# Patient Record
Sex: Female | Born: 1980 | Race: Black or African American | Hispanic: No | Marital: Single | Smoking: Current every day smoker
Health system: Southern US, Community
[De-identification: ages and names within clinical notes are randomized; demographics above are authoritative.]

## PROBLEM LIST (undated history)

## (undated) DIAGNOSIS — M5410 Radiculopathy, site unspecified: Secondary | ICD-10-CM

## (undated) DIAGNOSIS — K5190 Ulcerative colitis, unspecified, without complications: Secondary | ICD-10-CM

## (undated) DIAGNOSIS — K56609 Unspecified intestinal obstruction, unspecified as to partial versus complete obstruction: Secondary | ICD-10-CM

## (undated) DIAGNOSIS — M4800 Spinal stenosis, site unspecified: Secondary | ICD-10-CM

## (undated) DIAGNOSIS — F99 Mental disorder, not otherwise specified: Secondary | ICD-10-CM

---

## 2001-07-06 ENCOUNTER — Emergency Department (HOSPITAL_COMMUNITY): Admission: EM | Admit: 2001-07-06 | Discharge: 2001-07-06 | Payer: Self-pay | Admitting: Emergency Medicine

## 2001-08-12 ENCOUNTER — Emergency Department (HOSPITAL_COMMUNITY): Admission: EM | Admit: 2001-08-12 | Discharge: 2001-08-13 | Payer: Self-pay | Admitting: Emergency Medicine

## 2002-03-09 ENCOUNTER — Emergency Department (HOSPITAL_COMMUNITY): Admission: EM | Admit: 2002-03-09 | Discharge: 2002-03-10 | Payer: Self-pay | Admitting: Emergency Medicine

## 2002-03-09 ENCOUNTER — Encounter: Payer: Self-pay | Admitting: Emergency Medicine

## 2002-03-14 ENCOUNTER — Emergency Department (HOSPITAL_COMMUNITY): Admission: EM | Admit: 2002-03-14 | Discharge: 2002-03-14 | Payer: Self-pay | Admitting: Emergency Medicine

## 2002-04-13 ENCOUNTER — Inpatient Hospital Stay (HOSPITAL_COMMUNITY): Admission: AD | Admit: 2002-04-13 | Discharge: 2002-04-13 | Payer: Self-pay | Admitting: *Deleted

## 2002-05-29 ENCOUNTER — Ambulatory Visit (HOSPITAL_COMMUNITY): Admission: RE | Admit: 2002-05-29 | Discharge: 2002-05-29 | Payer: Self-pay | Admitting: *Deleted

## 2002-10-10 ENCOUNTER — Inpatient Hospital Stay (HOSPITAL_COMMUNITY): Admission: AD | Admit: 2002-10-10 | Discharge: 2002-10-10 | Payer: Self-pay | Admitting: *Deleted

## 2002-10-20 ENCOUNTER — Inpatient Hospital Stay (HOSPITAL_COMMUNITY): Admission: AD | Admit: 2002-10-20 | Discharge: 2002-10-21 | Payer: Self-pay | Admitting: *Deleted

## 2003-04-22 ENCOUNTER — Emergency Department (HOSPITAL_COMMUNITY): Admission: EM | Admit: 2003-04-22 | Discharge: 2003-04-22 | Payer: Self-pay | Admitting: Emergency Medicine

## 2003-06-05 ENCOUNTER — Emergency Department (HOSPITAL_COMMUNITY): Admission: EM | Admit: 2003-06-05 | Discharge: 2003-06-05 | Payer: Self-pay

## 2003-12-05 ENCOUNTER — Emergency Department (HOSPITAL_COMMUNITY): Admission: EM | Admit: 2003-12-05 | Discharge: 2003-12-05 | Payer: Self-pay | Admitting: Emergency Medicine

## 2003-12-07 ENCOUNTER — Emergency Department (HOSPITAL_COMMUNITY): Admission: EM | Admit: 2003-12-07 | Discharge: 2003-12-07 | Payer: Self-pay | Admitting: *Deleted

## 2003-12-08 ENCOUNTER — Encounter: Admission: RE | Admit: 2003-12-08 | Discharge: 2003-12-08 | Payer: Self-pay | Admitting: Gastroenterology

## 2004-01-07 ENCOUNTER — Emergency Department (HOSPITAL_COMMUNITY): Admission: EM | Admit: 2004-01-07 | Discharge: 2004-01-07 | Payer: Self-pay | Admitting: Emergency Medicine

## 2004-04-09 ENCOUNTER — Emergency Department (HOSPITAL_COMMUNITY): Admission: EM | Admit: 2004-04-09 | Discharge: 2004-04-09 | Payer: Self-pay | Admitting: Family Medicine

## 2004-09-04 ENCOUNTER — Emergency Department (HOSPITAL_COMMUNITY): Admission: EM | Admit: 2004-09-04 | Discharge: 2004-09-04 | Payer: Self-pay | Admitting: Emergency Medicine

## 2005-03-01 ENCOUNTER — Emergency Department (HOSPITAL_COMMUNITY): Admission: EM | Admit: 2005-03-01 | Discharge: 2005-03-01 | Payer: Self-pay | Admitting: Emergency Medicine

## 2006-06-04 ENCOUNTER — Emergency Department (HOSPITAL_COMMUNITY): Admission: EM | Admit: 2006-06-04 | Discharge: 2006-06-04 | Payer: Self-pay | Admitting: Emergency Medicine

## 2007-03-26 ENCOUNTER — Emergency Department (HOSPITAL_COMMUNITY): Admission: EM | Admit: 2007-03-26 | Discharge: 2007-03-26 | Payer: Self-pay | Admitting: Family Medicine

## 2008-06-16 ENCOUNTER — Observation Stay (HOSPITAL_COMMUNITY): Admission: EM | Admit: 2008-06-16 | Discharge: 2008-06-16 | Payer: Self-pay | Admitting: Emergency Medicine

## 2008-06-17 ENCOUNTER — Emergency Department (HOSPITAL_COMMUNITY): Admission: EM | Admit: 2008-06-17 | Discharge: 2008-06-18 | Payer: Self-pay | Admitting: Emergency Medicine

## 2009-01-18 ENCOUNTER — Emergency Department (HOSPITAL_COMMUNITY): Admission: EM | Admit: 2009-01-18 | Discharge: 2009-01-18 | Payer: Self-pay | Admitting: Emergency Medicine

## 2010-01-23 ENCOUNTER — Inpatient Hospital Stay (HOSPITAL_COMMUNITY)
Admission: EM | Admit: 2010-01-23 | Discharge: 2010-01-29 | Payer: Self-pay | Source: Home / Self Care | Admitting: Emergency Medicine

## 2010-06-06 LAB — COMPREHENSIVE METABOLIC PANEL
ALT: <8 U/L (ref 0–35)
CO2: 27 mEq/L (ref 19–32)
GFR calc Af Amer: >60 mL/min (ref >60)
Glucose, Bld: 101 mg/dL — ABNORMAL HIGH (ref 70–99)
Total Protein: 6.2 g/dL (ref 6.0–8.3)

## 2010-06-06 LAB — BASIC METABOLIC PANEL
BUN: <1 mg/dL — ABNORMAL LOW (ref 6–23)
CO2: 27 mEq/L (ref 19–32)
Calcium: 8.7 mg/dL (ref 8.4–10.5)
Creatinine, Ser: 0.59 mg/dL (ref 0.4–1.2)
GFR calc non Af Amer: >60 mL/min (ref >60)
Glucose, Bld: 109 mg/dL — ABNORMAL HIGH (ref 70–99)
Potassium: 3.8 mEq/L (ref 3.5–5.1)
Sodium: 139 mEq/L (ref 135–145)

## 2010-06-06 LAB — CBC
HCT: 34 % — ABNORMAL LOW (ref 36.0–46.0)
Hemoglobin: 10.8 g/dL — ABNORMAL LOW (ref 12.0–15.0)
Hemoglobin: 11.3 g/dL — ABNORMAL LOW (ref 12.0–15.0)
Hemoglobin: 11.5 g/dL — ABNORMAL LOW (ref 12.0–15.0)
MCH: 30.8 pg (ref 26.0–34.0)
MCH: 31.1 pg (ref 26.0–34.0)
MCV: 91.2 fL (ref 78.0–100.0)
MCV: 91.5 fL (ref 78.0–100.0)
Platelets: 157 10*3/uL (ref 150–400)
Platelets: 168 10*3/uL (ref 150–400)
Platelets: 170 10*3/uL (ref 150–400)
RBC: 3.73 MIL/uL — ABNORMAL LOW (ref 3.87–5.11)
RDW: 12.9 % (ref 11.5–15.5)
WBC: 6.5 10*3/uL (ref 4.0–10.5)
WBC: 8.2 10*3/uL (ref 4.0–10.5)

## 2010-06-06 LAB — LACTIC ACID, PLASMA: Lactic Acid, Venous: 1 mmol/L (ref 0.5–2.2)

## 2010-06-06 LAB — SEDIMENTATION RATE: Sed Rate: 16 mm/hr (ref 0–22)

## 2010-06-06 LAB — PHOSPHORUS: Phosphorus: 3.4 mg/dL (ref 2.3–4.6)

## 2010-06-07 LAB — DIFFERENTIAL
Basophils Relative: 0 % (ref 0–1)
Eosinophils Absolute: 0.1 10*3/uL (ref 0.0–0.7)
Monocytes Absolute: 1 10*3/uL (ref 0.1–1.0)
Monocytes Relative: 8 % (ref 3–12)

## 2010-06-07 LAB — URINALYSIS, ROUTINE W REFLEX MICROSCOPIC
Bilirubin Urine: NEGATIVE
Glucose, UA: NEGATIVE mg/dL
Hgb urine dipstick: NEGATIVE
Ketones, ur: NEGATIVE mg/dL
Protein, ur: NEGATIVE mg/dL
Specific Gravity, Urine: 1.026 (ref 1.005–1.030)
Urobilinogen, UA: 1 mg/dL (ref 0.0–1.0)
pH: 6 (ref 5.0–8.0)

## 2010-06-07 LAB — WET PREP, GENITAL

## 2010-06-07 LAB — GC/CHLAMYDIA PROBE AMP, GENITAL: GC Probe Amp, Genital: NEGATIVE

## 2010-06-07 LAB — CBC
HCT: 41.2 % (ref 36.0–46.0)
Hemoglobin: 13.8 g/dL (ref 12.0–15.0)
MCH: 30.9 pg (ref 26.0–34.0)
MCV: 92.2 fL (ref 78.0–100.0)
Platelets: 196 10*3/uL (ref 150–400)
RBC: 4.47 MIL/uL (ref 3.87–5.11)

## 2010-06-07 LAB — BASIC METABOLIC PANEL
Calcium: 9.2 mg/dL (ref 8.4–10.5)
Chloride: 106 mEq/L (ref 96–112)
GFR calc Af Amer: >60 mL/min (ref >60)
Potassium: 3.6 mEq/L (ref 3.5–5.1)
Sodium: 139 mEq/L (ref 135–145)

## 2010-06-07 LAB — URINE MICROSCOPIC-ADD ON

## 2010-07-06 LAB — URINE MICROSCOPIC-ADD ON

## 2010-07-06 LAB — DIFFERENTIAL
Basophils Absolute: 0 10*3/uL (ref 0.0–0.1)
Eosinophils Absolute: 0 10*3/uL (ref 0.0–0.7)
Eosinophils Relative: 0 % (ref 0–5)
Lymphocytes Relative: 8 % — ABNORMAL LOW (ref 12–46)
Lymphs Abs: 1.1 10*3/uL (ref 0.7–4.0)
Lymphs Abs: 1.9 10*3/uL (ref 0.7–4.0)
Monocytes Relative: 12 % (ref 3–12)
Neutro Abs: 10.8 10*3/uL — ABNORMAL HIGH (ref 1.7–7.7)
Neutrophils Relative %: 59 % (ref 43–77)
Neutrophils Relative %: 80 % — ABNORMAL HIGH (ref 43–77)

## 2010-07-06 LAB — URINALYSIS, ROUTINE W REFLEX MICROSCOPIC
Glucose, UA: 100 mg/dL — AB
Glucose, UA: NEGATIVE mg/dL
Specific Gravity, Urine: 1.017 (ref 1.005–1.030)
Specific Gravity, Urine: 1.017 (ref 1.005–1.030)
Urobilinogen, UA: 4 mg/dL — ABNORMAL HIGH (ref 0.0–1.0)

## 2010-07-06 LAB — CBC
HCT: 32.4 % — ABNORMAL LOW (ref 36.0–46.0)
MCV: 91.4 fL (ref 78.0–100.0)
MCV: 91.8 fL (ref 78.0–100.0)
Platelets: 149 10*3/uL — ABNORMAL LOW (ref 150–400)
RBC: 4.12 MIL/uL (ref 3.87–5.11)
RDW: 13.4 % (ref 11.5–15.5)
WBC: 13.6 10*3/uL — ABNORMAL HIGH (ref 4.0–10.5)
WBC: 7.8 10*3/uL (ref 4.0–10.5)

## 2010-07-06 LAB — URINE CULTURE

## 2010-07-06 LAB — BASIC METABOLIC PANEL
BUN: 3 mg/dL — ABNORMAL LOW (ref 6–23)
Chloride: 101 mEq/L (ref 96–112)
Creatinine, Ser: 0.59 mg/dL (ref 0.4–1.2)
GFR calc non Af Amer: >60 mL/min (ref >60)
Glucose, Bld: 87 mg/dL (ref 70–99)
Potassium: 3.6 mEq/L (ref 3.5–5.1)

## 2010-07-06 LAB — POCT I-STAT, CHEM 8
BUN: 6 mg/dL (ref 6–23)
Chloride: 96 mEq/L (ref 96–112)
Creatinine, Ser: 0.8 mg/dL (ref 0.4–1.2)
Glucose, Bld: 114 mg/dL — ABNORMAL HIGH (ref 70–99)
Potassium: 3.3 mEq/L — ABNORMAL LOW (ref 3.5–5.1)
Sodium: 129 mEq/L — ABNORMAL LOW (ref 135–145)

## 2010-07-06 LAB — POCT PREGNANCY, URINE: Preg Test, Ur: NEGATIVE

## 2010-10-19 ENCOUNTER — Encounter: Payer: Self-pay | Admitting: Obstetrics & Gynecology

## 2010-12-29 LAB — POCT RAPID STREP A: Streptococcus, Group A Screen (Direct): POSITIVE — AB

## 2011-01-02 ENCOUNTER — Inpatient Hospital Stay (INDEPENDENT_AMBULATORY_CARE_PROVIDER_SITE_OTHER)
Admission: RE | Admit: 2011-01-02 | Discharge: 2011-01-02 | Disposition: A | Discharge status: Home / Self Care | Payer: Self-pay | Source: Ambulatory Visit | Attending: Family Medicine | Admitting: Family Medicine

## 2011-01-02 DIAGNOSIS — A64 Unspecified sexually transmitted disease: Secondary | ICD-10-CM

## 2011-01-02 LAB — POCT PREGNANCY, URINE: Preg Test, Ur: NEGATIVE

## 2011-01-03 LAB — GC/CHLAMYDIA PROBE AMP, GENITAL: Chlamydia, DNA Probe: NEGATIVE

## 2011-01-24 ENCOUNTER — Encounter: Payer: Self-pay | Admitting: Obstetrics & Gynecology

## 2011-12-06 ENCOUNTER — Emergency Department (HOSPITAL_COMMUNITY)
Admission: EM | Admit: 2011-12-06 | Discharge: 2011-12-06 | Discharge status: Against Medical Advice | Payer: Medicaid Other | Attending: Emergency Medicine | Admitting: Emergency Medicine

## 2011-12-06 ENCOUNTER — Encounter (HOSPITAL_COMMUNITY): Payer: Self-pay | Admitting: *Deleted

## 2011-12-06 ENCOUNTER — Emergency Department (HOSPITAL_COMMUNITY)
Admission: EM | Admit: 2011-12-06 | Discharge: 2011-12-07 | Disposition: A | Discharge status: Home / Self Care | Payer: Medicaid Other | Attending: Emergency Medicine | Admitting: Emergency Medicine

## 2011-12-06 ENCOUNTER — Encounter (HOSPITAL_COMMUNITY): Payer: Self-pay | Admitting: Emergency Medicine

## 2011-12-06 ENCOUNTER — Emergency Department (HOSPITAL_COMMUNITY): Payer: Medicaid Other

## 2011-12-06 DIAGNOSIS — K529 Noninfective gastroenteritis and colitis, unspecified: Secondary | ICD-10-CM

## 2011-12-06 DIAGNOSIS — F17200 Nicotine dependence, unspecified, uncomplicated: Secondary | ICD-10-CM | POA: Insufficient documentation

## 2011-12-06 DIAGNOSIS — R109 Unspecified abdominal pain: Secondary | ICD-10-CM

## 2011-12-06 DIAGNOSIS — K5289 Other specified noninfective gastroenteritis and colitis: Secondary | ICD-10-CM | POA: Insufficient documentation

## 2011-12-06 DIAGNOSIS — R1032 Left lower quadrant pain: Secondary | ICD-10-CM | POA: Insufficient documentation

## 2011-12-06 DIAGNOSIS — R197 Diarrhea, unspecified: Secondary | ICD-10-CM | POA: Insufficient documentation

## 2011-12-06 DIAGNOSIS — N390 Urinary tract infection, site not specified: Secondary | ICD-10-CM

## 2011-12-06 HISTORY — DX: Unspecified intestinal obstruction, unspecified as to partial versus complete obstruction: K56.609

## 2011-12-06 LAB — URINALYSIS, ROUTINE W REFLEX MICROSCOPIC
Glucose, UA: NEGATIVE mg/dL
Nitrite: NEGATIVE
Specific Gravity, Urine: 1.025 (ref 1.005–1.030)
pH: 5.5 (ref 5.0–8.0)

## 2011-12-06 LAB — CBC WITH DIFFERENTIAL/PLATELET
Basophils Absolute: 0 10*3/uL (ref 0.0–0.1)
Basophils Relative: 0 % (ref 0–1)
Basophils Relative: 0 % (ref 0–1)
Eosinophils Relative: 0 % (ref 0–5)
HCT: 42 % (ref 36.0–46.0)
HCT: 36.7 % (ref 36.0–46.0)
Hemoglobin: 14.6 g/dL (ref 12.0–15.0)
Hemoglobin: 12.7 g/dL (ref 12.0–15.0)
Lymphocytes Relative: 15 % (ref 12–46)
Lymphocytes Relative: 12 % (ref 12–46)
Lymphs Abs: 1.6 10*3/uL (ref 0.7–4.0)
MCHC: 34.8 g/dL (ref 30.0–36.0)
MCHC: 34.6 g/dL (ref 30.0–36.0)
MCV: 92.2 fL (ref 78.0–100.0)
Monocytes Absolute: 0.8 10*3/uL (ref 0.1–1.0)
Monocytes Absolute: 0.6 10*3/uL (ref 0.1–1.0)
Monocytes Relative: 7 % (ref 3–12)
Monocytes Relative: 5 % (ref 3–12)
Neutro Abs: 8.2 10*3/uL — ABNORMAL HIGH (ref 1.7–7.7)
Neutro Abs: 9.3 10*3/uL — ABNORMAL HIGH (ref 1.7–7.7)
RBC: 4.53 MIL/uL (ref 3.87–5.11)
RDW: 13.5 % (ref 11.5–15.5)

## 2011-12-06 LAB — COMPREHENSIVE METABOLIC PANEL
Alkaline Phosphatase: 75 U/L (ref 39–117)
BUN: 8 mg/dL (ref 6–23)
CO2: 24 mEq/L (ref 19–32)
Chloride: 104 mEq/L (ref 96–112)
Creatinine, Ser: 0.67 mg/dL (ref 0.50–1.10)
GFR calc Af Amer: >90 mL/min (ref >90)
GFR calc non Af Amer: >90 mL/min (ref >90)
Glucose, Bld: 74 mg/dL (ref 70–99)
Potassium: 3.6 mEq/L (ref 3.5–5.1)
Total Bilirubin: 0.6 mg/dL (ref 0.3–1.2)

## 2011-12-06 LAB — POCT I-STAT, CHEM 8
Chloride: 105 mEq/L (ref 96–112)
Glucose, Bld: 79 mg/dL (ref 70–99)
HCT: 45 % (ref 36.0–46.0)
Potassium: 3.6 mEq/L (ref 3.5–5.1)
Sodium: 141 mEq/L (ref 135–145)

## 2011-12-06 LAB — LIPASE, BLOOD: Lipase: 14 U/L (ref 11–59)

## 2011-12-06 LAB — URINE MICROSCOPIC-ADD ON

## 2011-12-06 MED ORDER — SODIUM CHLORIDE 0.9 % IV SOLN
Freq: Once | INTRAVENOUS | Status: AC
Start: 2011-12-06 — End: 2011-12-07
  Administered 2011-12-06: 23:00:00 via INTRAVENOUS

## 2011-12-06 MED ORDER — SODIUM CHLORIDE 0.9 % IV BOLUS (SEPSIS)
1000.0000 mL | INTRAVENOUS | Status: AC
  Administered 2011-12-06: 1000 mL via INTRAVENOUS

## 2011-12-06 MED ORDER — HYDROMORPHONE HCL PF 1 MG/ML IJ SOLN
1.0000 mg | Freq: Once | INTRAMUSCULAR | Status: DC
  Filled 2011-12-06: qty 1

## 2011-12-06 MED ORDER — MORPHINE SULFATE 4 MG/ML IJ SOLN
4.0000 mg | Freq: Once | INTRAMUSCULAR | Status: AC
  Administered 2011-12-06: 4 mg via INTRAVENOUS
  Filled 2011-12-06: qty 1

## 2011-12-06 MED ORDER — ONDANSETRON HCL 4 MG/2ML IJ SOLN
4.0000 mg | Freq: Once | INTRAMUSCULAR | Status: AC
  Administered 2011-12-06: 4 mg via INTRAVENOUS
  Filled 2011-12-06: qty 2

## 2011-12-06 NOTE — ED Notes (Signed)
Patient reports she had onset of abd pain at 0730 and had onset of diarrhea.  Patient has hx of bowel obstruction and her sx feel same.  She states her last normal bm was 2 weeks ago.  Patient denies n/v.  She reports normal appetitie

## 2011-12-06 NOTE — ED Provider Notes (Signed)
History     CSN: [id number]  Arrival date & time 12/06/11  7829   First MD Initiated Contact with Patient 12/06/11 2212      Chief Complaint  Patient presents with  . Abdominal Pain    (Consider location/radiation/quality/duration/timing/severity/associated sxs/prior treatment) Patient is a 31 y.o. female presenting with abdominal pain. The history is provided by the patient.  Abdominal Pain The primary symptoms of the illness include abdominal pain.  She had onset this morning of severe, crampy left lower quadrant pain without radiation. There is associated nausea but no vomiting. She denies fever, chills, sweats. She has had diarrhea. Pain is not improved after a bowel movement. She denies urinary urgency, frequency, and tenesmus. She denies flank pain. She been in the emergency department for this pain but had to leave because of a test that she had to take. She now presents to continue her evaluation. She states that similar pain in the past this into a bowel obstruction. Pain is worse with movement but nothing makes it better.  Past Medical History  Diagnosis Date  . Bowel obstruction   . Diabetes mellitus     resolved after having daughter    History reviewed. No pertinent past surgical history.  History reviewed. No pertinent family history.  History  Substance Use Topics  . Smoking status: Current Every Day Smoker -- 0.3 packs/day    Types: Cigarettes  . Smokeless tobacco: Not on file  . Alcohol Use: Yes    OB History    Grav Para Term Preterm Abortions TAB SAB Ect Mult Living                  Review of Systems  Gastrointestinal: Positive for abdominal pain.  All other systems reviewed and are negative.    Allergies  Vicodin  Home Medications   Current Outpatient Rx  Name Route Sig Dispense Refill  . ADULT MULTIVITAMIN W/MINERALS CH Oral Take 1 tablet by mouth daily.      BP 141/71  Pulse 91  Temp 99 F (37.2 C) (Oral)  Resp 16  SpO2 100%   LMP 11/29/2011  Physical Exam  Nursing note and vitals reviewed. 31year old female, resting comfortably and in no acute distress. Vital signs are significant for borderline hypertension with blood pressure 141/71. Oxygen saturation is 100%, which is normal. Head is normocephalic and atraumatic. PERRLA, EOMI. Oropharynx is clear. Neck is nontender and supple without adenopathy or JVD. Back is nontender and there is no CVA tenderness. Lungs are clear without rales, wheezes, or rhonchi. Chest is nontender. Heart has regular rate and rhythm without murmur. Abdomen is soft, flat, without masses or hepatosplenomegaly and peristalsis is hypoactive. There is generalized abdominal tenderness which seems to be worse in the left lower quadrant. There is no rebound or guarding. Extremities have no cyanosis or edema, full range of motion is present. Skin is warm and dry without rash. Neurologic: Mental status is normal, cranial nerves are intact, there are no motor or sensory deficits.   ED Course  Procedures (including critical care time)  Results for orders placed during the hospital encounter of 12/06/11  CBC WITH DIFFERENTIAL      Component Value Range   WBC 11.3 (*) 4.0 - 10.5 K/uL   RBC 3.98  3.87 - 5.11 MIL/uL   Hemoglobin 12.7  12.0 - 15.0 g/dL   HCT 56.2  13.0 - 86.5 %   MCV 92.2  78.0 - 100.0 fL   MCH 31.9  26.0 - 34.0 pg   MCHC 34.6  30.0 - 36.0 g/dL   RDW 16.1  09.6 - 04.5 %   Platelets 174  150 - 400 K/uL   Neutrophils Relative 83 (*) 43 - 77 %   Neutro Abs 9.3 (*) 1.7 - 7.7 K/uL   Lymphocytes Relative 12  12 - 46 %   Lymphs Abs 1.3  0.7 - 4.0 K/uL   Monocytes Relative 5  3 - 12 %   Monocytes Absolute 0.6  0.1 - 1.0 K/uL   Eosinophils Relative 0  0 - 5 %   Eosinophils Absolute 0.0  0.0 - 0.7 K/uL   Basophils Relative 0  0 - 1 %   Basophils Absolute 0.0  0.0 - 0.1 K/uL  URINALYSIS, ROUTINE W REFLEX MICROSCOPIC      Component Value Range   Color, Urine AMBER (*) YELLOW    APPearance CLOUDY (*) CLEAR   Specific Gravity, Urine 1.022  1.005 - 1.030   pH 6.0  5.0 - 8.0   Glucose, UA NEGATIVE  NEGATIVE mg/dL   Hgb urine dipstick NEGATIVE  NEGATIVE   Bilirubin Urine SMALL (*) NEGATIVE   Ketones, ur 15 (*) NEGATIVE mg/dL   Protein, ur NEGATIVE  NEGATIVE mg/dL   Urobilinogen, UA 0.2  0.0 - 1.0 mg/dL   Nitrite NEGATIVE  NEGATIVE   Leukocytes, UA SMALL (*) NEGATIVE  URINE MICROSCOPIC-ADD ON      Component Value Range   Squamous Epithelial / LPF FEW (*) RARE   WBC, UA 11-20  <3 WBC/hpf   RBC / HPF 0-2  <3 RBC/hpf   Bacteria, UA FEW (*) RARE   Urine-Other MUCOUS PRESENT     Dg Abd Acute W/chest  12/06/2011  *RADIOLOGY REPORT*  Clinical Data: Left lower quadrant pain.  ACUTE ABDOMEN SERIES (ABDOMEN 2 VIEW & CHEST 1 VIEW)  Comparison: CT 01/23/2010  Findings: The bowel gas pattern is normal.  There is no evidence of free intraperitoneal air.  No suspicious radio-opaque calculi or other significant radiographic abnormality is seen. Heart size and mediastinal contours are within normal limits.  Both lungs are clear.  IMPRESSION: No acute findings.   Original Report Authenticated By: Cyndie Chime, M.D.       1. Abdominal pain   2. Urinary tract infection       MDM  Severe abdominal pain. She had left AMA this morning, but had normal WBC and normal metabolic panel. Urinalysis had significant pyuria but also had a significant number of epithelial cells suggesting a contaminated specimen, so urinalysis will be repeated along with culture. Pregnancy test has not been done this morning and will be checked tonight.. Exam this evening is not localizing. CT had been ordered this morning and will be ordered again.  Laboratory workup shows a slightly elevated WBC compared with this morning. Catheterized urine shows significant pyuria and urinary tract infection may be the cause of her symptoms. CT is pending. Case is endorsed to Dr. Norlene Campbell to evaluate CT  report.      Dione Booze, MD 12/07/11 [phone]

## 2011-12-06 NOTE — ED Provider Notes (Signed)
History     CSN: [id number]  Arrival date & time 12/06/11  4098   First MD Initiated Contact with Patient 12/06/11 1014      Chief Complaint  Patient presents with  . Abdominal Pain    (Consider location/radiation/quality/duration/timing/severity/associated sxs/prior treatment) Patient is a 31 y.o. female presenting with abdominal pain. The history is provided by the patient.  Abdominal Pain The primary symptoms of the illness include abdominal pain and diarrhea. The primary symptoms of the illness do not include fever, fatigue, shortness of breath, nausea, vomiting or dysuria. Episode onset: 3-4 hours ago. The onset of the illness was gradual. The problem has been gradually worsening.  Onset: 3-4 hrs ago. The pain came on gradually. The abdominal pain has been gradually worsening since its onset. Pain Location: lower abd, worse in LLQ. The abdominal pain does not radiate. The severity of the abdominal pain is 10/10. The abdominal pain is relieved by nothing. The abdominal pain is exacerbated by bowel movements.  The patient has had a change in bowel habit. Symptoms associated with the illness do not include hematuria or back pain.    Past Medical History  Diagnosis Date  . Bowel obstruction   . Diabetes mellitus     resolved after having daughter    History reviewed. No pertinent past surgical history.  No family history on file.  History  Substance Use Topics  . Smoking status: Current Every Day Smoker -- 0.3 packs/day    Types: Cigarettes  . Smokeless tobacco: Not on file  . Alcohol Use: Yes    OB History    Grav Para Term Preterm Abortions TAB SAB Ect Mult Living                  Review of Systems  Constitutional: Negative for fever and fatigue.  HENT: Negative for congestion, drooling and neck pain.   Eyes: Negative for pain.  Respiratory: Negative for cough and shortness of breath.   Cardiovascular: Negative for chest pain.  Gastrointestinal: Positive for  abdominal pain and diarrhea. Negative for nausea and vomiting.  Genitourinary: Negative for dysuria and hematuria.  Musculoskeletal: Negative for back pain and gait problem.  Skin: Negative for color change.  Neurological: Negative for dizziness and headaches.  Hematological: Negative for adenopathy.  Psychiatric/Behavioral: Negative for behavioral problems.  All other systems reviewed and are negative.    Allergies  Vicodin  Home Medications   Current Outpatient Rx  Name Route Sig Dispense Refill  . ADULT MULTIVITAMIN W/MINERALS CH Oral Take 1 tablet by mouth daily.      BP 109/70  Pulse 73  Temp 98.6 F (37 C) (Oral)  Resp 14  Ht 5\' 6"  (1.676 m)  Wt 101 lb (45.813 kg)  BMI 16.30 kg/m2  SpO2 100%  LMP 11/29/2011  Physical Exam  Nursing note and vitals reviewed. Constitutional: She is oriented to person, place, and time. She appears well-developed and well-nourished.  HENT:  Head: Normocephalic.  Mouth/Throat: No oropharyngeal exudate.  Eyes: Conjunctivae normal and EOM are normal. Pupils are equal, round, and reactive to light.  Neck: Normal range of motion. Neck supple.  Cardiovascular: Normal rate, regular rhythm, normal heart sounds and intact distal pulses.  Exam reveals no gallop and no friction rub.   No murmur heard. Pulmonary/Chest: Effort normal and breath sounds normal. No respiratory distress. She has no wheezes.  Abdominal: Soft. Bowel sounds are normal. There is tenderness (mild ttp of lower abdomen, worse in LLQ). There is no  rebound and no guarding.  Musculoskeletal: Normal range of motion. She exhibits no edema and no tenderness.  Neurological: She is alert and oriented to person, place, and time.  Skin: Skin is warm and dry.  Psychiatric: She has a normal mood and affect. Her behavior is normal.    ED Course  Procedures (including critical care time)  Labs Reviewed  URINALYSIS, ROUTINE W REFLEX MICROSCOPIC - Abnormal; Notable for the following:     Color, Urine AMBER (*)  BIOCHEMICALS MAY BE AFFECTED BY COLOR   APPearance HAZY (*)     Bilirubin Urine SMALL (*)     Ketones, ur 15 (*)     Leukocytes, UA MODERATE (*)     All other components within normal limits  POCT I-STAT, CHEM 8 - Abnormal; Notable for the following:    Hemoglobin 15.3 (*)     All other components within normal limits  CBC WITH DIFFERENTIAL - Abnormal; Notable for the following:    WBC 10.6 (*)     Neutro Abs 8.2 (*)     All other components within normal limits  URINE MICROSCOPIC-ADD ON - Abnormal; Notable for the following:    Squamous Epithelial / LPF MANY (*)     Bacteria, UA FEW (*)     All other components within normal limits  COMPREHENSIVE METABOLIC PANEL  LIPASE, BLOOD   Dg Abd Acute W/chest  12/06/2011  *RADIOLOGY REPORT*  Clinical Data: Left lower quadrant pain.  ACUTE ABDOMEN SERIES (ABDOMEN 2 VIEW & CHEST 1 VIEW)  Comparison: CT 01/23/2010  Findings: The bowel gas pattern is normal.  There is no evidence of free intraperitoneal air.  No suspicious radio-opaque calculi or other significant radiographic abnormality is seen. Heart size and mediastinal contours are within normal limits.  Both lungs are clear.  IMPRESSION: No acute findings.   Original Report Authenticated By: Cyndie Chime, M.D.      1. Abdominal pain       MDM  8:36 PM 31 y.o. female w hx of DM who pw lower abd cramping and diarrhea that began several hours ago. Pt denies fever, bloody stools, vomiting. Pt AFVSS here, appears well on exam, abd soft, mild ttp of LLQ. Pt has hx of admission in 2011 d/t colitis and pyelonephritis.  Pt had coloscopy at that time, not concerning for IBD.  Will get labs, IVF, pain control.   Will get CT abdomen.    8:36 PM: Pt request to leave AMA before CT scan. Dr. Manus Gunning attempted to convince pt to stay. She is of sound mind and has decision making capacity. She was warned that leaving w/out knowing the cause of her abd pain could lead to  further pain, disability, undesired consequences, or even death. Pt understands. We also discussed returning to the ED immediately if new or worsening sx occur. We discussed the sx which are most concerning (e.g., worsening abd pain, fever) that necessitate immediate return. Welcomed pt's return. Any new prescriptions provided to the patient are listed below.  New Prescriptions   No medications on file   Clinical Impression 1. Abdominal pain         Purvis Sheffield, MD 12/06/11 2036

## 2011-12-06 NOTE — ED Notes (Signed)
Patient notified urine sample is needed.  

## 2011-12-06 NOTE — ED Notes (Signed)
Pt reports that she does not want to wait for the CT test because she has to take a test. Informed pt that she needs to drink one cup now and one in an hour. EDP informed.

## 2011-12-06 NOTE — ED Notes (Addendum)
Pt reports was here for same earlier today; abd pain lower left; LBM 2 weeks ago; denies n/v; pt had bloodwork, urinalysis, and abd xray done when here earlier today; had to leave for school obligations

## 2011-12-07 ENCOUNTER — Emergency Department (HOSPITAL_COMMUNITY): Payer: Medicaid Other

## 2011-12-07 LAB — URINALYSIS, ROUTINE W REFLEX MICROSCOPIC
Glucose, UA: NEGATIVE mg/dL
Ketones, ur: 15 mg/dL — AB
Nitrite: NEGATIVE
Protein, ur: NEGATIVE mg/dL

## 2011-12-07 LAB — URINE MICROSCOPIC-ADD ON

## 2011-12-07 MED ORDER — IOHEXOL 300 MG/ML  SOLN
20.0000 mL | INTRAMUSCULAR | Status: AC
  Administered 2011-12-07: 20 mL via ORAL

## 2011-12-07 MED ORDER — METRONIDAZOLE 500 MG PO TABS: 500.0000 mg | ORAL_TABLET | Freq: Two times a day (BID) | ORAL | Status: AC

## 2011-12-07 MED ORDER — METRONIDAZOLE IN NACL 5-0.79 MG/ML-% IV SOLN: 500.0000 mg | Freq: Once | INTRAVENOUS | Status: DC

## 2011-12-07 MED ORDER — CIPROFLOXACIN HCL 500 MG PO TABS
500.0000 mg | ORAL_TABLET | Freq: Once | ORAL | Status: AC
  Administered 2011-12-07: 500 mg via ORAL
  Filled 2011-12-07: qty 1

## 2011-12-07 MED ORDER — METRONIDAZOLE 500 MG PO TABS
500.0000 mg | ORAL_TABLET | Freq: Once | ORAL | Status: AC
  Administered 2011-12-07: 500 mg via ORAL
  Filled 2011-12-07: qty 1

## 2011-12-07 MED ORDER — CIPROFLOXACIN HCL 500 MG PO TABS: 500.0000 mg | ORAL_TABLET | Freq: Two times a day (BID) | ORAL | Status: AC

## 2011-12-07 MED ORDER — CIPROFLOXACIN IN D5W 400 MG/200ML IV SOLN: 400.0000 mg | Freq: Once | INTRAVENOUS | Status: DC

## 2011-12-07 MED ORDER — OXYCODONE-ACETAMINOPHEN 5-325 MG PO TABS: 2.0000 | ORAL_TABLET | ORAL | Status: AC | PRN

## 2011-12-07 MED ORDER — ONDANSETRON HCL 4 MG PO TABS: 4.0000 mg | ORAL_TABLET | Freq: Four times a day (QID) | ORAL | Status: AC

## 2011-12-07 NOTE — ED Notes (Signed)
The pt has one more cup of oral contrast to drink

## 2011-12-07 NOTE — ED Provider Notes (Signed)
I saw and evaluated the patient, reviewed the resident's note and I agree with the findings and plan.  Left-sided abdominal pain and diarrhea for the past several hours. History of bowel obstruction. Patient comfortably texting in the room but very dramatic and crying with abdominal exam and will not allow proper exam. X-ray unremarkable, labs noncontributory. Patient continues to be in pain but is unwilling to stay for CT scan, stating that she has an exam to take.  Glynn Octave, MD 12/07/11 [phone]

## 2011-12-07 NOTE — ED Provider Notes (Signed)
Results for orders placed during the hospital encounter of 12/06/11  CBC WITH DIFFERENTIAL      Component Value Range   WBC 11.3 (*) 4.0 - 10.5 K/uL   RBC 3.98  3.87 - 5.11 MIL/uL   Hemoglobin 12.7  12.0 - 15.0 g/dL   HCT 14.7  82.9 - 56.2 %   MCV 92.2  78.0 - 100.0 fL   MCH 31.9  26.0 - 34.0 pg   MCHC 34.6  30.0 - 36.0 g/dL   RDW 13.0  86.5 - 78.4 %   Platelets 174  150 - 400 K/uL   Neutrophils Relative 83 (*) 43 - 77 %   Neutro Abs 9.3 (*) 1.7 - 7.7 K/uL   Lymphocytes Relative 12  12 - 46 %   Lymphs Abs 1.3  0.7 - 4.0 K/uL   Monocytes Relative 5  3 - 12 %   Monocytes Absolute 0.6  0.1 - 1.0 K/uL   Eosinophils Relative 0  0 - 5 %   Eosinophils Absolute 0.0  0.0 - 0.7 K/uL   Basophils Relative 0  0 - 1 %   Basophils Absolute 0.0  0.0 - 0.1 K/uL  URINALYSIS, ROUTINE W REFLEX MICROSCOPIC      Component Value Range   Color, Urine AMBER (*) YELLOW   APPearance CLOUDY (*) CLEAR   Specific Gravity, Urine 1.022  1.005 - 1.030   pH 6.0  5.0 - 8.0   Glucose, UA NEGATIVE  NEGATIVE mg/dL   Hgb urine dipstick NEGATIVE  NEGATIVE   Bilirubin Urine SMALL (*) NEGATIVE   Ketones, ur 15 (*) NEGATIVE mg/dL   Protein, ur NEGATIVE  NEGATIVE mg/dL   Urobilinogen, UA 0.2  0.0 - 1.0 mg/dL   Nitrite NEGATIVE  NEGATIVE   Leukocytes, UA SMALL (*) NEGATIVE  URINE MICROSCOPIC-ADD ON      Component Value Range   Squamous Epithelial / LPF FEW (*) RARE   WBC, UA 11-20  <3 WBC/hpf   RBC / HPF 0-2  <3 RBC/hpf   Bacteria, UA FEW (*) RARE   Urine-Other MUCOUS PRESENT     Ct Abdomen Pelvis W Contrast  12/07/2011  *RADIOLOGY REPORT*  Clinical Data: Left lower quadrant pain and cramping.  CT ABDOMEN AND PELVIS WITH CONTRAST  Technique:  Multidetector CT imaging of the abdomen and pelvis was performed following the standard protocol during bolus administration of intravenous contrast.  Contrast:  80 ml Omnipaque-300.  Comparison: 01/23/2010  Findings: The lung bases are clear.  The liver, spleen, gallbladder,  pancreas, adrenal glands, kidneys, abdominal aorta, and retroperitoneal lymph nodes are unremarkable. Small accessory spleen.  The stomach and small bowel are are not abnormally distended.  There is diffuse edema and wall thickening involving the ascending and transverse colon and probably the terminal ileum.  This is consistent with colitis and could represent infectious or inflammatory causes.  The appendix is normal.  No free air or free fluid in the abdomen.  Pelvis:  The bladder wall is not thickened.  Small amount of free fluid in the pelvis which may be physiologic or inflammatory.  The uterus and adnexal structures are not enlarged.  No significant pelvic lymphadenopathy.  Normal alignment of the lumbar vertebrae.  IMPRESSION: Diffuse edema of the colonic wall involving the ascending and transverse segments and possibly involving the terminal ileum. Changes are consistent with infectious or inflammatory colitis or pseudomembranous colitis.  This is progressed since the previous study.   Original Report Authenticated By: Tawni Pummel.  STEVENS, M.D.    Dg Abd Acute W/chest  12/06/2011  *RADIOLOGY REPORT*  Clinical Data: Left lower quadrant pain.  ACUTE ABDOMEN SERIES (ABDOMEN 2 VIEW & CHEST 1 VIEW)  Comparison: CT 01/23/2010  Findings: The bowel gas pattern is normal.  There is no evidence of free intraperitoneal air.  No suspicious radio-opaque calculi or other significant radiographic abnormality is seen. Heart size and mediastinal contours are within normal limits.  Both lungs are clear.  IMPRESSION: No acute findings.   Original Report Authenticated By: Cyndie Chime, M.D.     3:36 AM CT scan showing inflammation consistent with colitis. Will treat with antibiotics and pain control. Will refer to outpatient GI. Findings discussed with patient. She does not wish IV dosing of medications as she has class the morning and wishes to get home and get some sleep before she has to go in. Patient given  precautions and reasons for return and possible admission. She understands it is important for her to followup with gastroenterology. She relates family history of ulcerative colitis in an uncle.  Olivia Mackie, MD 12/07/11 [phone]

## 2011-12-07 NOTE — ED Notes (Signed)
MD at bedside. 

## 2011-12-07 NOTE — ED Notes (Signed)
Pt to CT scan.

## 2011-12-07 NOTE — ED Notes (Signed)
Pt sleeping.  Will be scanned at 0145

## 2011-12-08 LAB — URINE CULTURE: Colony Count: NO GROWTH

## 2012-01-22 ENCOUNTER — Emergency Department (HOSPITAL_COMMUNITY): Payer: Medicaid Other

## 2012-01-22 ENCOUNTER — Encounter (HOSPITAL_COMMUNITY): Payer: Self-pay | Admitting: *Deleted

## 2012-01-22 ENCOUNTER — Emergency Department (HOSPITAL_COMMUNITY)
Admission: EM | Admit: 2012-01-22 | Discharge: 2012-01-22 | Disposition: A | Discharge status: Home / Self Care | Payer: Medicaid Other | Attending: Emergency Medicine | Admitting: Emergency Medicine

## 2012-01-22 DIAGNOSIS — S39012A Strain of muscle, fascia and tendon of lower back, initial encounter: Secondary | ICD-10-CM

## 2012-01-22 DIAGNOSIS — K5669 Other intestinal obstruction: Secondary | ICD-10-CM | POA: Insufficient documentation

## 2012-01-22 DIAGNOSIS — F17200 Nicotine dependence, unspecified, uncomplicated: Secondary | ICD-10-CM | POA: Insufficient documentation

## 2012-01-22 DIAGNOSIS — IMO0002 Reserved for concepts with insufficient information to code with codable children: Secondary | ICD-10-CM | POA: Insufficient documentation

## 2012-01-22 DIAGNOSIS — Z79899 Other long term (current) drug therapy: Secondary | ICD-10-CM | POA: Insufficient documentation

## 2012-01-22 DIAGNOSIS — K5180 Other ulcerative colitis without complications: Secondary | ICD-10-CM | POA: Insufficient documentation

## 2012-01-22 DIAGNOSIS — Y9289 Other specified places as the place of occurrence of the external cause: Secondary | ICD-10-CM | POA: Insufficient documentation

## 2012-01-22 DIAGNOSIS — S335XXA Sprain of ligaments of lumbar spine, initial encounter: Secondary | ICD-10-CM | POA: Insufficient documentation

## 2012-01-22 DIAGNOSIS — Y9389 Activity, other specified: Secondary | ICD-10-CM | POA: Insufficient documentation

## 2012-01-22 DIAGNOSIS — Z8632 Personal history of gestational diabetes: Secondary | ICD-10-CM | POA: Insufficient documentation

## 2012-01-22 HISTORY — DX: Ulcerative colitis, unspecified, without complications: K51.90

## 2012-01-22 MED ORDER — IBUPROFEN 600 MG PO TABS: 600.0000 mg | ORAL_TABLET | Freq: Four times a day (QID) | ORAL | Status: DC | PRN

## 2012-01-22 MED ORDER — CYCLOBENZAPRINE HCL 10 MG PO TABS: 10.0000 mg | ORAL_TABLET | Freq: Two times a day (BID) | ORAL | Status: DC | PRN

## 2012-01-22 NOTE — ED Notes (Signed)
Patient transported to X-ray 
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
## 2012-01-22 NOTE — ED Notes (Signed)
Patient requesting financial assistance to help pay for prescriptions.
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
## 2012-11-03 ENCOUNTER — Emergency Department (HOSPITAL_COMMUNITY)
Admission: EM | Admit: 2012-11-03 | Discharge: 2012-11-03 | Disposition: A | Discharge status: Home / Self Care | Payer: Medicaid Other | Attending: Emergency Medicine | Admitting: Emergency Medicine

## 2012-11-03 ENCOUNTER — Emergency Department (HOSPITAL_COMMUNITY): Payer: Medicaid Other

## 2012-11-03 ENCOUNTER — Encounter (HOSPITAL_COMMUNITY): Payer: Self-pay | Admitting: Emergency Medicine

## 2012-11-03 DIAGNOSIS — E119 Type 2 diabetes mellitus without complications: Secondary | ICD-10-CM | POA: Insufficient documentation

## 2012-11-03 DIAGNOSIS — Z885 Allergy status to narcotic agent status: Secondary | ICD-10-CM | POA: Insufficient documentation

## 2012-11-03 DIAGNOSIS — A599 Trichomoniasis, unspecified: Secondary | ICD-10-CM

## 2012-11-03 DIAGNOSIS — Z3202 Encounter for pregnancy test, result negative: Secondary | ICD-10-CM | POA: Insufficient documentation

## 2012-11-03 DIAGNOSIS — Z8719 Personal history of other diseases of the digestive system: Secondary | ICD-10-CM | POA: Insufficient documentation

## 2012-11-03 DIAGNOSIS — R109 Unspecified abdominal pain: Secondary | ICD-10-CM | POA: Insufficient documentation

## 2012-11-03 DIAGNOSIS — Z91040 Latex allergy status: Secondary | ICD-10-CM | POA: Insufficient documentation

## 2012-11-03 DIAGNOSIS — R110 Nausea: Secondary | ICD-10-CM | POA: Insufficient documentation

## 2012-11-03 DIAGNOSIS — Z79899 Other long term (current) drug therapy: Secondary | ICD-10-CM | POA: Insufficient documentation

## 2012-11-03 DIAGNOSIS — K5900 Constipation, unspecified: Secondary | ICD-10-CM | POA: Insufficient documentation

## 2012-11-03 DIAGNOSIS — A5900 Urogenital trichomoniasis, unspecified: Secondary | ICD-10-CM | POA: Insufficient documentation

## 2012-11-03 DIAGNOSIS — F17200 Nicotine dependence, unspecified, uncomplicated: Secondary | ICD-10-CM | POA: Insufficient documentation

## 2012-11-03 LAB — URINALYSIS, ROUTINE W REFLEX MICROSCOPIC
Bilirubin Urine: NEGATIVE
Nitrite: NEGATIVE
Specific Gravity, Urine: 1.012 (ref 1.005–1.030)
Urobilinogen, UA: 1 mg/dL (ref 0.0–1.0)
pH: 6.5 (ref 5.0–8.0)

## 2012-11-03 LAB — LIPASE, BLOOD: Lipase: 16 U/L (ref 11–59)

## 2012-11-03 LAB — C-REACTIVE PROTEIN: CRP: <0.5 mg/dL — ABNORMAL LOW (ref <0.60)

## 2012-11-03 LAB — COMPREHENSIVE METABOLIC PANEL
Albumin: 3.6 g/dL (ref 3.5–5.2)
Alkaline Phosphatase: 50 U/L (ref 39–117)
BUN: 7 mg/dL (ref 6–23)
Calcium: 8.9 mg/dL (ref 8.4–10.5)
GFR calc Af Amer: >90 mL/min (ref >90)
Potassium: 3.9 mEq/L (ref 3.5–5.1)
Sodium: 140 mEq/L (ref 135–145)
Total Protein: 6.4 g/dL (ref 6.0–8.3)

## 2012-11-03 LAB — OCCULT BLOOD, POC DEVICE: Fecal Occult Bld: NEGATIVE

## 2012-11-03 LAB — CBC WITH DIFFERENTIAL/PLATELET
Basophils Relative: 0 % (ref 0–1)
Eosinophils Absolute: 0.1 10*3/uL (ref 0.0–0.7)
Eosinophils Relative: 1 % (ref 0–5)
MCH: 32.1 pg (ref 26.0–34.0)
MCHC: 35.2 g/dL (ref 30.0–36.0)
MCV: 91 fL (ref 78.0–100.0)
Monocytes Relative: 8 % (ref 3–12)
Neutrophils Relative %: 66 % (ref 43–77)
Platelets: 180 10*3/uL (ref 150–400)

## 2012-11-03 LAB — SEDIMENTATION RATE: Sed Rate: 4 mm/hr (ref 0–22)

## 2012-11-03 LAB — URINE MICROSCOPIC-ADD ON

## 2012-11-03 MED ORDER — KETOROLAC TROMETHAMINE 30 MG/ML IJ SOLN
30.0000 mg | Freq: Once | INTRAMUSCULAR | Status: AC
  Administered 2012-11-03: 30 mg via INTRAVENOUS
  Filled 2012-11-03: qty 1

## 2012-11-03 MED ORDER — MORPHINE SULFATE 4 MG/ML IJ SOLN
4.0000 mg | Freq: Once | INTRAMUSCULAR | Status: AC
  Administered 2012-11-03: 4 mg via INTRAVENOUS
  Filled 2012-11-03: qty 1

## 2012-11-03 MED ORDER — POLYETHYLENE GLYCOL 3350 17 GM/SCOOP PO POWD: ORAL | Status: DC

## 2012-11-03 MED ORDER — AZITHROMYCIN 250 MG PO TABS
1000.0000 mg | ORAL_TABLET | Freq: Once | ORAL | Status: AC
  Administered 2012-11-03: 1000 mg via ORAL
  Filled 2012-11-03: qty 4

## 2012-11-03 MED ORDER — METRONIDAZOLE 500 MG PO TABS: 500.0000 mg | ORAL_TABLET | Freq: Two times a day (BID) | ORAL | Status: DC

## 2012-11-03 MED ORDER — CEFTRIAXONE SODIUM 250 MG IJ SOLR
250.0000 mg | Freq: Once | INTRAMUSCULAR | Status: AC
  Administered 2012-11-03: 250 mg via INTRAMUSCULAR
  Filled 2012-11-03: qty 250

## 2012-11-03 MED ORDER — ONDANSETRON HCL 4 MG/2ML IJ SOLN
4.0000 mg | Freq: Once | INTRAMUSCULAR | Status: AC
  Administered 2012-11-03: 4 mg via INTRAVENOUS
  Filled 2012-11-03: qty 2

## 2012-11-03 MED ORDER — METRONIDAZOLE 500 MG PO TABS
500.0000 mg | ORAL_TABLET | Freq: Once | ORAL | Status: AC
  Administered 2012-11-03: 500 mg via ORAL
  Filled 2012-11-03: qty 1

## 2012-11-03 MED ORDER — LIDOCAINE HCL (PF) 1 % IJ SOLN
INTRAMUSCULAR | Status: AC
  Administered 2012-11-03: 0.9 mL via INTRAMUSCULAR
  Filled 2012-11-03: qty 5

## 2012-11-03 NOTE — ED Notes (Signed)
Pt reports left upper abdominal pain x 2 weeks. Pt reports cramping in rectal area x 2 days. Pt reports history of ulcerative colitis. Pt is experiencing nausea today and unable to have a bowel movement.

## 2012-11-03 NOTE — ED Notes (Signed)
Patient transported to X-ray 

## 2012-11-03 NOTE — ED Provider Notes (Signed)
CSN: [id number]     Arrival date & time 11/03/12  0847 History     First MD Initiated Contact with Patient 11/03/12 0901     Chief Complaint  Patient presents with  . Abdominal Pain  . Nausea   (Consider location/radiation/quality/duration/timing/severity/associated sxs/prior Treatment) HPI Comments: This 32 year old female with history of colitis who presents with 2 weeks of abdominal pain. The patient reports constipation and does not remember when she had her last bowel movement. She describes abdominal pain as crampy and left-sided. It waxes and wanes. It is worse with eating. His her current pain is 7/10. She has not taken anything for pain. Patient denies any bloody stools. She denies any dysuria or fevers. Last period was 2 weeks again.  Patient is a 32 y.o. female presenting with abdominal pain. The history is provided by the patient.  Abdominal Pain Pain location:  LUQ and L flank Pain quality: aching, bloating and cramping   Pain quality: not sharp   Pain radiates to:  Does not radiate Pain severity:  Moderate Onset quality:  Gradual Duration:  2 weeks Timing:  Intermittent Progression:  Unchanged Chronicity:  Recurrent Context: eating   Relieved by:  Nothing Worsened by:  Eating Ineffective treatments:  None tried Associated symptoms: nausea and vaginal discharge   Associated symptoms: no chest pain, no diarrhea, no dysuria, no fever, no hematochezia and no vomiting     Past Medical History  Diagnosis Date  . Bowel obstruction   . Diabetes mellitus     resolved after having daughter  . Ulcerative colitis    History reviewed. No pertinent past surgical history. History reviewed. No pertinent family history. History  Substance Use Topics  . Smoking status: Current Every Day Smoker -- 0.33 packs/day    Types: Cigarettes  . Smokeless tobacco: Not on file  . Alcohol Use: No   OB History   Grav Para Term Preterm Abortions TAB SAB Ect Mult Living                 Review of Systems  Constitutional: Negative for fever.  Cardiovascular: Negative for chest pain.  Gastrointestinal: Positive for nausea and abdominal pain. Negative for vomiting, diarrhea and hematochezia.  Genitourinary: Positive for vaginal discharge. Negative for dysuria.  Skin: Negative for rash.  Neurological: Negative for headaches.  All other systems reviewed and are negative.    Allergies  Vicodin and Latex  Home Medications   Current Outpatient Rx  Name  Route  Sig  Dispense  Refill  . polyethylene glycol powder (GLYCOLAX/MIRALAX) powder      Take one capful 3 times a day until stools are soft. Once you're having regular stools, decrease to 1 time a day.   255 g   0    BP 94/57  Pulse 57  Temp(Src) 98.3 F (36.8 C) (Oral)  Resp 16  Ht 5\' 5"  (1.651 m)  Wt 100 lb (45.36 kg)  BMI 16.64 kg/m2  SpO2 100%  LMP 10/20/2012 Physical Exam  Constitutional: She is oriented to person, place, and time. She appears well-developed and well-nourished.  HENT:  Head: Normocephalic and atraumatic.  Cardiovascular: Normal rate, regular rhythm and normal heart sounds.   Pulmonary/Chest: Effort normal and breath sounds normal.  Abdominal: Soft. Bowel sounds are normal. She exhibits no distension and no mass. There is no tenderness. There is no rebound and no guarding.  Genitourinary: Rectum normal and uterus normal. Rectal exam shows no external hemorrhoid, no fissure, no mass and no  tenderness. Guaiac negative stool. Pelvic exam was performed with patient supine. Cervix exhibits discharge. Cervix exhibits no motion tenderness. Right adnexum displays no tenderness. Left adnexum displays no tenderness. No tenderness around the vagina. Vaginal discharge found.  Neurological: She is alert and oriented to person, place, and time. She has normal reflexes.  Skin: Skin is warm and dry.  Psychiatric: She has a normal mood and affect.    ED Course   Procedures (including critical care  time)  Labs Reviewed  COMPREHENSIVE METABOLIC PANEL - Abnormal; Notable for the following:    Total Bilirubin 0.2 (*)    All other components within normal limits  URINALYSIS, ROUTINE W REFLEX MICROSCOPIC - Abnormal; Notable for the following:    Leukocytes, UA SMALL (*)    All other components within normal limits  URINE MICROSCOPIC-ADD ON - Abnormal; Notable for the following:    Squamous Epithelial / LPF MANY (*)    All other components within normal limits  CBC WITH DIFFERENTIAL  LIPASE, BLOOD  SEDIMENTATION RATE  C-REACTIVE PROTEIN  OCCULT BLOOD, POC DEVICE  POCT PREGNANCY, URINE   Dg Abd 1 View  11/03/2012   *RADIOLOGY REPORT*  Clinical Data: Left sided abdominal pain, nausea, history of colitis  ABDOMEN - 1 VIEW  Comparison: 12/06/2011  Findings: No abnormally dilated loops of bowel.  Gas is seen into the distal colon.  No abnormal densities.  IMPRESSION: Negative study   Original Report Authenticated By: Esperanza Heir, M.D.   1. Constipation   2. Abdominal pain     MDM  This is a 32 year old female with a history of colitis who presents with 2 weeks of constipation and abdominal pain. Patient states that she has had episodes like this in the past and was told that it was due to ulcerative colitis. At this time she is not currently on any medications for ulcerative colitis and does not see a GI doctor. Review of patient's chart reveals normal inflammatories markers. Physical exam is benign. Basic lab work is within normal limits including inflammatory markers. A KUB was obtained and is within normal limits. Hemoccult was negative. Patient was able to po challenge.  I discussed with the patient the option of doing a CT scan but failed this time will be low yield as she has no infectious symptoms and has a benign abdominal exam at this time. Urinalysis is positive for small leuks and trichomoniasis.  Further discussion with the patient reveals that she has had copious white discharge  over the last several weeks. She denies any new sexual partners. GU exam was notable for discharge without adnexal or cervical motion tenderness. Patient was given IM Rocephin, azithromycin, and metronidazole to cover for GC and Chlamydia as well.Marland Kitchen She was educated on safe sex practices and was advised to have her partner treated. Patient stated understanding. I feel the patient's presentation is a mostly likely a combination of constipation and possible mild PID. I have little suspicion for [facility] as the patient has no right upper quadrant pain. Patient will be discharged home with a full course of Flagyl and MiraLax for constipation.  Shon Baton, MD 11/03/12 1314

## 2012-11-04 LAB — GC/CHLAMYDIA PROBE AMP
CT Probe RNA: NEGATIVE
GC Probe RNA: NEGATIVE

## 2013-08-27 IMAGING — CR DG ABDOMEN ACUTE W/ 1V CHEST
3 series · 3 of 3 positions shown · non-contrast
Comparison: CT 01/23/2010

CLINICAL DATA: Left lower quadrant pain.

ACUTE ABDOMEN SERIES (ABDOMEN 2 VIEW & CHEST 1 VIEW)

[w chest pa]
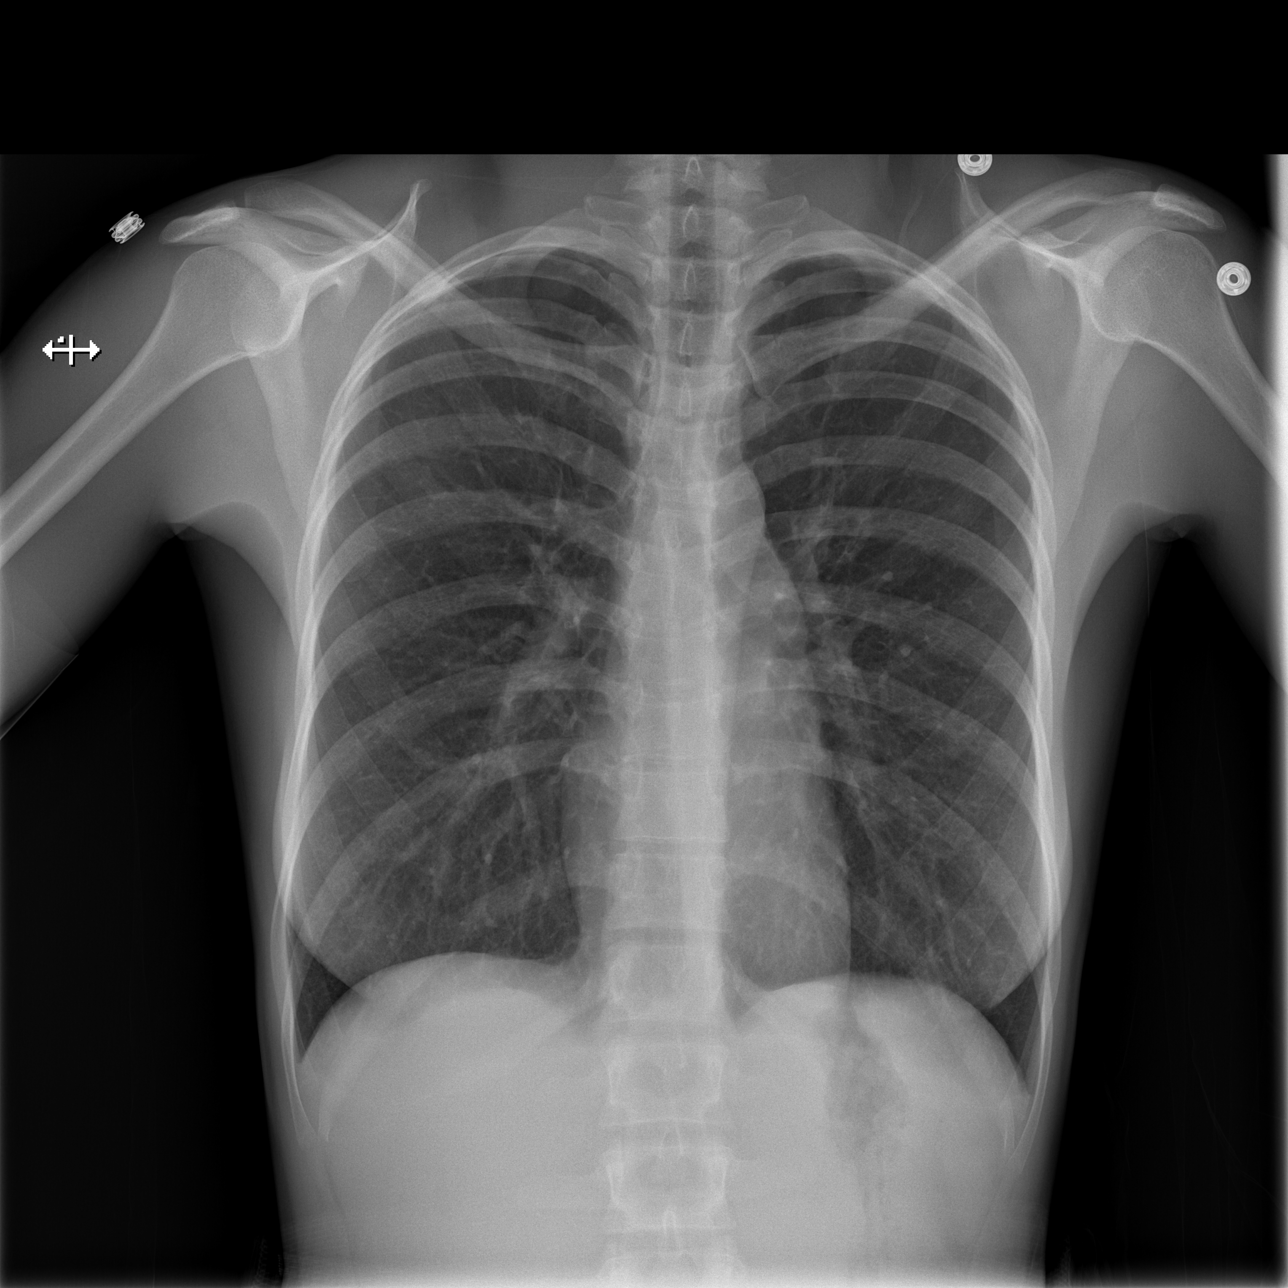

[w abdomen upright]
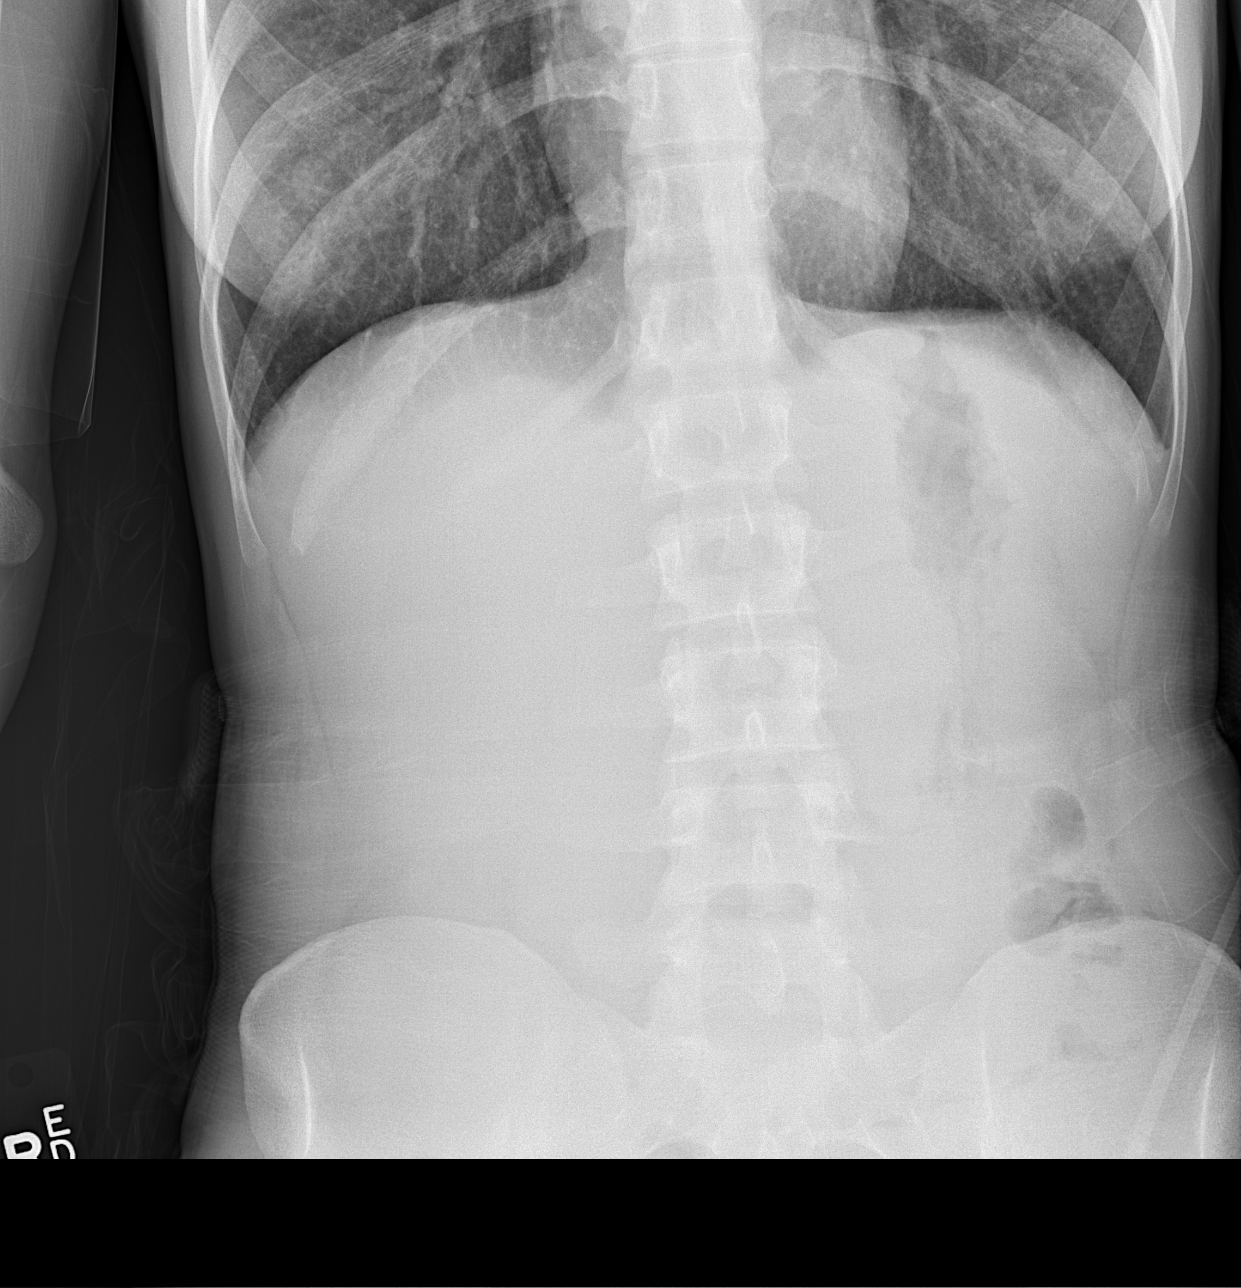

[t abdomen supine]
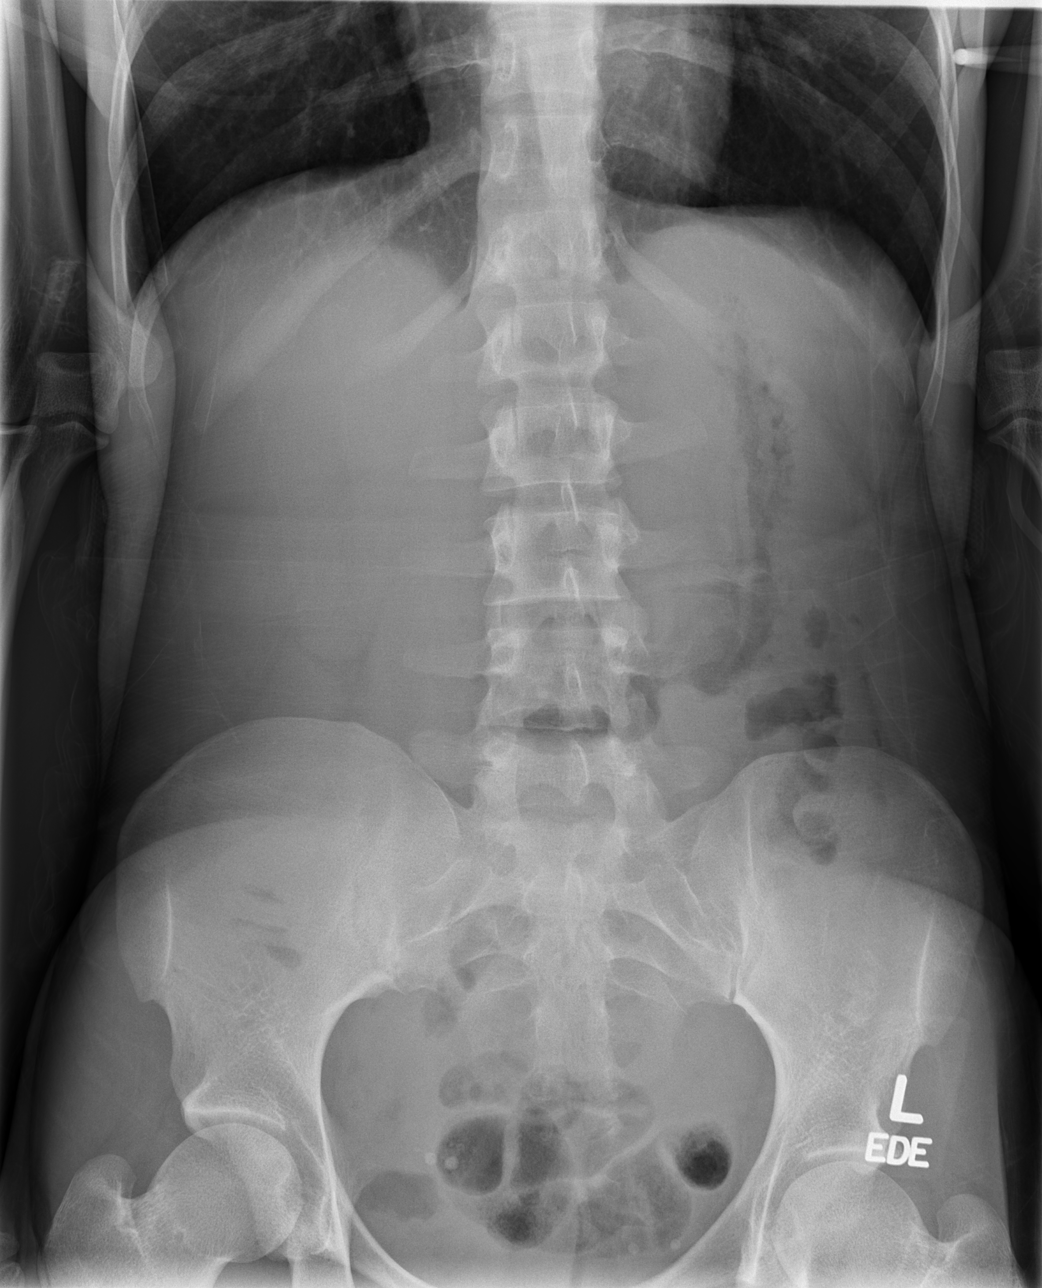

[3 of 3 positions shown; findings below may reference images not displayed]

FINDINGS: The bowel gas pattern is normal.  There is no evidence of
free intraperitoneal air.  No suspicious radio-opaque calculi or
other significant radiographic abnormality is seen. Heart size and
mediastinal contours are within normal limits.  Both lungs are
clear.
IMPRESSION: No acute findings.

## 2013-12-04 ENCOUNTER — Emergency Department (HOSPITAL_COMMUNITY)
Admission: EM | Admit: 2013-12-04 | Discharge: 2013-12-04 | Disposition: A | Discharge status: Home / Self Care | Payer: Medicaid Other | Attending: Emergency Medicine | Admitting: Emergency Medicine

## 2013-12-04 ENCOUNTER — Encounter (HOSPITAL_COMMUNITY): Payer: Self-pay | Admitting: Emergency Medicine

## 2013-12-04 DIAGNOSIS — M7989 Other specified soft tissue disorders: Secondary | ICD-10-CM | POA: Diagnosis present

## 2013-12-04 DIAGNOSIS — F17200 Nicotine dependence, unspecified, uncomplicated: Secondary | ICD-10-CM | POA: Insufficient documentation

## 2013-12-04 DIAGNOSIS — Z91040 Latex allergy status: Secondary | ICD-10-CM | POA: Diagnosis not present

## 2013-12-04 DIAGNOSIS — Z3202 Encounter for pregnancy test, result negative: Secondary | ICD-10-CM | POA: Diagnosis not present

## 2013-12-04 DIAGNOSIS — E119 Type 2 diabetes mellitus without complications: Secondary | ICD-10-CM | POA: Diagnosis not present

## 2013-12-04 DIAGNOSIS — Z79899 Other long term (current) drug therapy: Secondary | ICD-10-CM | POA: Insufficient documentation

## 2013-12-04 DIAGNOSIS — Z8719 Personal history of other diseases of the digestive system: Secondary | ICD-10-CM | POA: Diagnosis not present

## 2013-12-04 LAB — COMPREHENSIVE METABOLIC PANEL
ALBUMIN: 3.8 g/dL (ref 3.5–5.2)
ALK PHOS: 54 U/L (ref 39–117)
ALT: 11 U/L (ref 0–35)
ANION GAP: 11 (ref 5–15)
AST: 11 U/L (ref 0–37)
BUN: 5 mg/dL — ABNORMAL LOW (ref 6–23)
CO2: 25 mEq/L (ref 19–32)
Calcium: 9.1 mg/dL (ref 8.4–10.5)
Chloride: 104 mEq/L (ref 96–112)
Creatinine, Ser: 0.58 mg/dL (ref 0.50–1.10)
GFR calc Af Amer: >90 mL/min (ref >90)
GFR calc non Af Amer: >90 mL/min (ref >90)
Glucose, Bld: 81 mg/dL (ref 70–99)
POTASSIUM: 3.7 meq/L (ref 3.7–5.3)
Sodium: 140 mEq/L (ref 137–147)
Total Bilirubin: <0.2 mg/dL — ABNORMAL LOW (ref 0.3–1.2)
Total Protein: 6.5 g/dL (ref 6.0–8.3)

## 2013-12-04 LAB — URINE MICROSCOPIC-ADD ON

## 2013-12-04 LAB — URINALYSIS, ROUTINE W REFLEX MICROSCOPIC
Bilirubin Urine: NEGATIVE
GLUCOSE, UA: NEGATIVE mg/dL
KETONES UR: NEGATIVE mg/dL
Nitrite: NEGATIVE
PROTEIN: NEGATIVE mg/dL
Specific Gravity, Urine: 1.02 (ref 1.005–1.030)
Urobilinogen, UA: 0.2 mg/dL (ref 0.0–1.0)
pH: 6 (ref 5.0–8.0)

## 2013-12-04 LAB — CBC WITH DIFFERENTIAL/PLATELET
BASOS PCT: 0 % (ref 0–1)
Basophils Absolute: 0 10*3/uL (ref 0.0–0.1)
EOS ABS: 0.2 10*3/uL (ref 0.0–0.7)
EOS PCT: 2 % (ref 0–5)
HCT: 35.6 % — ABNORMAL LOW (ref 36.0–46.0)
HEMOGLOBIN: 12.2 g/dL (ref 12.0–15.0)
Lymphocytes Relative: 35 % (ref 12–46)
Lymphs Abs: 3.2 10*3/uL (ref 0.7–4.0)
MCH: 31.2 pg (ref 26.0–34.0)
MCHC: 34.3 g/dL (ref 30.0–36.0)
MCV: 91 fL (ref 78.0–100.0)
MONOS PCT: 8 % (ref 3–12)
Monocytes Absolute: 0.7 10*3/uL (ref 0.1–1.0)
NEUTROS PCT: 55 % (ref 43–77)
Neutro Abs: 5.2 10*3/uL (ref 1.7–7.7)
PLATELETS: 217 10*3/uL (ref 150–400)
RBC: 3.91 MIL/uL (ref 3.87–5.11)
RDW: 13.5 % (ref 11.5–15.5)
WBC: 9.3 10*3/uL (ref 4.0–10.5)

## 2013-12-04 LAB — PREGNANCY, URINE: Preg Test, Ur: NEGATIVE

## 2013-12-04 MED ORDER — IBUPROFEN 800 MG PO TABS
800.0000 mg | ORAL_TABLET | Freq: Once | ORAL | Status: DC
Start: 2013-12-04 — End: 2013-12-05
  Filled 2013-12-04: qty 1

## 2013-12-04 MED ORDER — ACETAMINOPHEN 325 MG PO TABS: 650.0000 mg | ORAL_TABLET | Freq: Once | ORAL | Status: DC

## 2013-12-04 NOTE — ED Notes (Addendum)
Pt refused to take ibuprofen due to her ulcerative colitis. Requesting a shot of morphine because "it works quicker".  Dr. Bebe Shaggy informed of pt's request.

## 2013-12-04 NOTE — ED Provider Notes (Signed)
CSN: [id number]     Arrival date & time 12/04/13  1742 History   First MD Initiated Contact with Patient 12/04/13 2153     Chief Complaint  Patient presents with  . Leg Swelling   The history is provided by the patient.   Regina Howell is a 33 y.o. female with PMHS for UC who presents for lower extremity pain and edema for one year.  Onset: Gradual.  Progression: unchanged.  Severity: moderate. Exacerbating factors: none. Relieving factors: unknown.  She has not been treated for this condition before.  She denies trauamtic injury.  No difficulty with ambulation.  She denies fevers, chills, calf tenderness, pleurisy, chest pain, dyspnea, orthopnea, DOE, PND, n/v, abd pain, dysuria, polyuria.  She has no history of DVT.  No OCP use. Diet unchanged. No hx of kidney disease.   Past Medical History  Diagnosis Date  . Bowel obstruction   . Diabetes mellitus     resolved after having daughter  . Ulcerative colitis    History reviewed. No pertinent past surgical history. No family history on file. History  Substance Use Topics  . Smoking status: Current Every Day Smoker -- 0.33 packs/day    Types: Cigarettes  . Smokeless tobacco: Not on file  . Alcohol Use: No   OB History   Grav Para Term Preterm Abortions TAB SAB Ect Mult Living                 Review of Systems  Constitutional: Negative for fever and chills.  Respiratory: Negative for cough, shortness of breath and wheezing.   Cardiovascular: Positive for leg swelling. Negative for chest pain and palpitations.  Gastrointestinal: Negative for nausea, vomiting and abdominal pain.  Musculoskeletal: Negative for back pain.  Skin: Negative for rash.  Neurological: Negative for weakness and numbness.  All other systems reviewed and are negative.     Allergies  Vicodin and Latex  Home Medications   Prior to Admission medications   Medication Sig Start Date End Date Taking? Authorizing Provider  metroNIDAZOLE (FLAGYL) 500 MG  tablet Take 1 tablet (500 mg total) by mouth 2 (two) times daily. 11/03/12   Shon Baton, MD  polyethylene glycol powder (GLYCOLAX/MIRALAX) powder Take one capful 3 times a day until stools are soft. Once you're having regular stools, decrease to 1 time a day. 11/03/12   Shon Baton, MD   BP 114/65  Pulse 73  Temp(Src) 98.4 F (36.9 C)  Resp 12  Ht  (1.651 m)  Wt 103 lb (46.72 kg)  BMI 17.14 kg/m2  SpO2 100%  LMP 12/04/2013 Physical Exam  Nursing note and vitals reviewed. Constitutional: She is oriented to person, place, and time. She appears well-developed and well-nourished. No distress.  Well appearing  HENT:  Head: Normocephalic and atraumatic.  Nose: Nose normal.  Mouth/Throat: No oropharyngeal exudate.  Eyes: Conjunctivae are normal. Pupils are equal, round, and reactive to light.  Neck: Normal range of motion. Neck supple. No JVD present. No tracheal deviation present.  Cardiovascular: Normal rate, regular rhythm and normal heart sounds.   No murmur heard. Pulses equal, strong  Pulmonary/Chest: Effort normal and breath sounds normal. No respiratory distress. She has no wheezes. She has no rales.  Abdominal: Soft. Bowel sounds are normal. She exhibits no distension and no mass. There is no tenderness.  Musculoskeletal: Normal range of motion. She exhibits no edema and no tenderness.  No calf TTP, edema, erythema or palpable cords  Neurological: She  is alert and oriented to person, place, and time.  Normal strength and sensation. Reflexes equal  Skin: Skin is warm and dry. No rash noted.  Psychiatric: She has a normal mood and affect.    ED Course  Procedures (including critical care time) Labs Review Labs Reviewed  URINALYSIS, ROUTINE W REFLEX MICROSCOPIC - Abnormal; Notable for the following:    APPearance CLOUDY (*)    Hgb urine dipstick LARGE (*)    Leukocytes, UA SMALL (*)    All other components within normal limits  URINE MICROSCOPIC-ADD ON -  Abnormal; Notable for the following:    Squamous Epithelial / LPF MANY (*)    Bacteria, UA FEW (*)    All other components within normal limits  CBC WITH DIFFERENTIAL - Abnormal; Notable for the following:    HCT 35.6 (*)    All other components within normal limits  COMPREHENSIVE METABOLIC PANEL - Abnormal; Notable for the following:    BUN 5 (*)    Total Bilirubin <0.2 (*)    All other components within normal limits  PREGNANCY, URINE    Imaging Review No results found.   EKG Interpretation None      MDM   Final diagnoses:  Leg swelling    Pt presents for leg swelling for one year.  Etiology unclear per history and exam.  There is no edema appreciated on exam.  NVI. VSS. Lungs CTAB. No signs of volume overload to indicate CHF. Labs unremarkable. No anemia, electrolyte abnormality, proteinuria, or kidney dysfunction. No injury. No signs of infection.  No evidence of DVT. Patient has no PCP. Provided contact information for follow up.    Sofie Rower, MD 12/05/13 [phone]

## 2013-12-04 NOTE — Discharge Instructions (Signed)

## 2013-12-04 NOTE — ED Notes (Signed)
Per friend at bedside pt at first requesting Tylenol for pain, her friend now states pt wants something "stronger" but not "too strong."

## 2013-12-04 NOTE — ED Notes (Signed)
MD Kengal at bedside to discuss discharge orders and results with pt

## 2013-12-04 NOTE — ED Notes (Signed)
Pt presents with ongoing BLE swelling x2 years and BLE pain x2-3 days. Pt also feeling weak, lethargic and feeling like she wants to "pass out" when she's in the heat. 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

## 2013-12-04 NOTE — ED Notes (Signed)
Swollen legs for the past year.  seh works 2 jobs and she reports that she needs a note for work.  She has started having bi-lateral feet pain also in the past 6 mopths  lmp now

## 2013-12-04 NOTE — ED Notes (Signed)
Pt discharged home with all belongings, pt alert, oriented and ambulatory upon discharge. No new RX prescribed, pt driven home by friend. Pt escorted to exit via wheelchair by [employer]

## 2013-12-07 NOTE — ED Provider Notes (Signed)
I have personally seen and examined the patient.  I have discussed the plan of care with the resident.  I have reviewed the documentation on PMH/FH/Soc. History.  I have reviewed the documentation of the resident and agree.  Pt well appearing, no distress, low suspicion for DVT Stable for d/c home and outpatient management   Joya Gaskins, MD 12/07/13 [phone]

## 2016-09-15 ENCOUNTER — Encounter (HOSPITAL_COMMUNITY): Payer: Self-pay | Admitting: *Deleted

## 2016-09-15 ENCOUNTER — Emergency Department (HOSPITAL_COMMUNITY)
Admission: EM | Admit: 2016-09-15 | Discharge: 2016-09-15 | Disposition: A | Discharge status: Home / Self Care | Payer: Medicaid Other | Attending: Emergency Medicine | Admitting: Emergency Medicine

## 2016-09-15 DIAGNOSIS — R1010 Upper abdominal pain, unspecified: Secondary | ICD-10-CM

## 2016-09-15 DIAGNOSIS — N390 Urinary tract infection, site not specified: Secondary | ICD-10-CM

## 2016-09-15 DIAGNOSIS — Z885 Allergy status to narcotic agent status: Secondary | ICD-10-CM | POA: Insufficient documentation

## 2016-09-15 DIAGNOSIS — F17210 Nicotine dependence, cigarettes, uncomplicated: Secondary | ICD-10-CM | POA: Insufficient documentation

## 2016-09-15 DIAGNOSIS — Z91040 Latex allergy status: Secondary | ICD-10-CM | POA: Insufficient documentation

## 2016-09-15 HISTORY — DX: Mental disorder, not otherwise specified: F99

## 2016-09-15 LAB — URINALYSIS, ROUTINE W REFLEX MICROSCOPIC
Bilirubin Urine: NEGATIVE
Glucose, UA: NEGATIVE mg/dL
Hgb urine dipstick: NEGATIVE
Ketones, ur: NEGATIVE mg/dL
Leukocytes, UA: NEGATIVE
Nitrite: POSITIVE — AB
PROTEIN: NEGATIVE mg/dL
Specific Gravity, Urine: 1.019 (ref 1.005–1.030)
pH: 6 (ref 5.0–8.0)

## 2016-09-15 LAB — COMPREHENSIVE METABOLIC PANEL
ALT: 15 U/L (ref 14–54)
AST: 20 U/L (ref 15–41)
Albumin: 4.2 g/dL (ref 3.5–5.0)
Alkaline Phosphatase: 51 U/L (ref 38–126)
Anion gap: 6 (ref 5–15)
BUN: 9 mg/dL (ref 6–20)
CHLORIDE: 106 mmol/L (ref 101–111)
CO2: 25 mmol/L (ref 22–32)
Calcium: 9.1 mg/dL (ref 8.9–10.3)
Creatinine, Ser: 0.73 mg/dL (ref 0.44–1.00)
GFR calc non Af Amer: >60 mL/min (ref >60)
Glucose, Bld: 100 mg/dL — ABNORMAL HIGH (ref 65–99)
POTASSIUM: 4 mmol/L (ref 3.5–5.1)
Sodium: 137 mmol/L (ref 135–145)
Total Bilirubin: 0.4 mg/dL (ref 0.3–1.2)
Total Protein: 7.1 g/dL (ref 6.5–8.1)

## 2016-09-15 LAB — CBC
HEMATOCRIT: 37.2 % (ref 36.0–46.0)
HEMOGLOBIN: 12.4 g/dL (ref 12.0–15.0)
MCH: 31.1 pg (ref 26.0–34.0)
MCHC: 33.3 g/dL (ref 30.0–36.0)
MCV: 93.2 fL (ref 78.0–100.0)
Platelets: 219 10*3/uL (ref 150–400)
RBC: 3.99 MIL/uL (ref 3.87–5.11)
RDW: 13.6 % (ref 11.5–15.5)
WBC: 8.8 10*3/uL (ref 4.0–10.5)

## 2016-09-15 LAB — LIPASE, BLOOD: LIPASE: 22 U/L (ref 11–51)

## 2016-09-15 LAB — C-REACTIVE PROTEIN: CRP: <0.8 mg/dL (ref <1.0)

## 2016-09-15 LAB — POC URINE PREG, ED: PREG TEST UR: NEGATIVE

## 2016-09-15 MED ORDER — OMEPRAZOLE 20 MG PO CPDR: 20.0000 mg | DELAYED_RELEASE_CAPSULE | Freq: Every day | ORAL | 0 refills | Status: DC

## 2016-09-15 MED ORDER — CEPHALEXIN 500 MG PO CAPS: 500.0000 mg | ORAL_CAPSULE | Freq: Two times a day (BID) | ORAL | 0 refills | Status: DC

## 2016-09-15 NOTE — ED Provider Notes (Signed)
[facility]-EMERGENCY DEPT Provider Note   CSN: [id number] Arrival date & time: 09/15/16  1519     History   Chief Complaint Chief Complaint  Patient presents with  . Abdominal Pain    HPI Regina Howell is a 36 y.o. female.  36 year old female with past mental history below who presents with abdominal pain. She reports 1 month of constant, gradually worsening upper abdominal pain.It is worse with laughing, coughing, and movement. She has been lost to follow up w/ GI for her UC. She denies any urinary symptoms, vaginal sx, vomiting, diarrhea, fevers. Last BM was 3 days ago, she reports mild constipation and had a small amount of blood in her stool but no ongoing bleeding.     Abdominal Pain      Past Medical History:  Diagnosis Date  . Bowel obstruction (HCC)   . Diabetes mellitus    resolved after having daughter  . Mental health disorder   . Ulcerative colitis (HCC)     There are no active problems to display for this patient.   History reviewed. No pertinent surgical history.  OB History    No data available       Home Medications    Prior to Admission medications   Medication Sig Start Date End Date Taking? Authorizing Provider  cephALEXin (KEFLEX) 500 MG capsule Take 1 capsule (500 mg total) by mouth 2 (two) times daily. 09/15/16   Demar Shad, Ambrose Finlandachel Morgan, MD  omeprazole (PRILOSEC) 20 MG capsule Take 1 capsule (20 mg total) by mouth daily. 09/15/16   Lareen Mullings, Ambrose Finlandachel Morgan, MD  polyethylene glycol powder [facility](GLYCOLAX/MIRALAX) powder Take one capful 3 times a day until stools are soft. Once you're having regular stools, decrease to 1 time a day. 11/03/12   Horton, Mayer Maskerourtney F, MD    Family History History reviewed. No pertinent family history.  Social History Social History  Substance Use Topics  . Smoking status: Current Every Day Smoker    Packs/day: 0.33    Types: Cigarettes  . Smokeless tobacco: Never Used  . Alcohol use No     Allergies   Vicodin  [hydrocodone-acetaminophen] and Latex   Review of Systems Review of Systems  Gastrointestinal: Positive for abdominal pain.     Physical Exam Updated Vital Signs BP 111/72   Pulse 71   Temp 98.3 F (36.8 C) (Oral)   Resp 16   Ht 5\' 6"  (1.676 m)   Wt 46.7 kg (103 lb)   LMP 05/18/2016   SpO2 100%   BMI 16.62 kg/m   Physical Exam  Constitutional: She is oriented to person, place, and time. She appears well-developed and well-nourished. No distress.  Playing on phone  HENT:  Head: Normocephalic and atraumatic.  Moist mucous membranes  Eyes: Conjunctivae are normal. Pupils are equal, round, and reactive to light.  Neck: Neck supple.  Cardiovascular: Normal rate, regular rhythm and normal heart sounds.   No murmur heard. Pulmonary/Chest: Effort normal and breath sounds normal.  Abdominal: Soft. Bowel sounds are normal. She exhibits no distension. There is tenderness (midepigastric to just above umbilicus).  Musculoskeletal: She exhibits no edema.  Neurological: She is alert and oriented to person, place, and time.  Fluent speech  Skin: Skin is warm and dry.  Psychiatric: She has a normal mood and affect. Judgment normal.  Nursing note and vitals reviewed.    ED Treatments / Results  Labs (all labs ordered are listed, but only abnormal results are displayed) Labs Reviewed  COMPREHENSIVE METABOLIC  PANEL - Abnormal; Notable for the following:       Result Value   Glucose, Bld 100 (*)    All other components within normal limits  URINALYSIS, ROUTINE W REFLEX MICROSCOPIC - Abnormal; Notable for the following:    APPearance CLOUDY (*)    Nitrite POSITIVE (*)    Bacteria, UA MANY (*)    Squamous Epithelial / LPF 0-5 (*)    All other components within normal limits  URINE CULTURE  LIPASE, BLOOD  CBC  C-REACTIVE PROTEIN  POC URINE PREG, ED    EKG  EKG Interpretation None       Radiology No results found.  Procedures Procedures (including critical care  time)  Medications Ordered in ED Medications - No data to display   Initial Impression / Assessment and Plan / ED Course  I have reviewed the triage vital signs and the nursing notes.  Pertinent labs  that were available during my care of the patient were reviewed by me and considered in my medical decision making (see chart for details).    Pt w/ reported hx of UC w/ 1 month persistent abd pain. She was well-appearing on exam with normal vital signs. Mild upper abdominal tenderness with no distention or lower abdominal tenderness. Her lab work here is unremarkable including normal WBC count, lipase, negative inflammatory markers. Given these normal labs, I doubt IBD flare. Given the chronicity of her symptoms I also doubt acute intra-abdominal process such as appendicitis, diverticulitis, or obstruction. She has a follow-up appointment with a PCP in 2 days and I have counseled on the importance of following up to arrange for GI referral for ongoing care. She does endorse acid reflux symptoms and I have started on PPI, have counseled on GERD diet. Reviewed return cautions and patient discharged in satisfactory condition.  Final Clinical Impressions(s) / ED Diagnoses   Final diagnoses:  Pain of upper abdomen  Urinary tract infection without hematuria, site unspecified    New Prescriptions Discharge Medication List as of 09/15/2016  6:27 PM    START taking these medications   Details  cephALEXin (KEFLEX) 500 MG capsule Take 1 capsule (500 mg total) by mouth 2 (two) times daily., Starting Sat 09/15/2016, Print    omeprazole (PRILOSEC) 20 MG capsule Take 1 capsule (20 mg total) by mouth daily., Starting Sat 09/15/2016, Print         Adilene Areola, Ambrose Finland, MD 09/16/16 [phone]

## 2016-09-15 NOTE — ED Triage Notes (Signed)
The pt is c/o abd pain for one month with sl nausea.  She has ulcerative colitis/ history  lmp 4 months ago whenever she started taking   Mental health medication

## 2016-09-17 LAB — URINE CULTURE
Culture: >=100000 — AB
SPECIAL REQUESTS: NORMAL

## 2016-09-18 ENCOUNTER — Telehealth: Payer: Self-pay | Admitting: Emergency Medicine

## 2016-09-18 NOTE — Telephone Encounter (Signed)
Post ED Visit - Positive Culture Follow-up  Culture report reviewed by antimicrobial stewardship pharmacist:  []  Regina Howell, Pharm.D. []  Regina Howell, Pharm.D., BCPS AQ-ID []  Regina Howell, Pharm.D., BCPS [x]  Regina Howell, [address]Pharm[address]D., BCPS []  Regina Howell, [place] BCPS, AAHIVP []  Regina Howell, Pharm.D., BCPS, AAHIVP []  Regina Howell, PharmD, BCPS []  Regina Howell, PharmD, BCPS []  Regina Howell, PharmD, BCPS  Positive urine culture Treated with cephalexin, organism sensitive to the same and no further patient follow-up is required at this time.  Regina Howell, Regina Howell 09/18/2016, 10:00 AM

## 2016-11-20 ENCOUNTER — Ambulatory Visit (HOSPITAL_COMMUNITY)
Admission: EM | Admit: 2016-11-20 | Discharge: 2016-11-20 | Disposition: A | Discharge status: Home / Self Care | Payer: Self-pay | Attending: Emergency Medicine | Admitting: Emergency Medicine

## 2016-11-20 ENCOUNTER — Ambulatory Visit (INDEPENDENT_AMBULATORY_CARE_PROVIDER_SITE_OTHER): Payer: Self-pay

## 2016-11-20 ENCOUNTER — Encounter (HOSPITAL_COMMUNITY): Payer: Self-pay | Admitting: Emergency Medicine

## 2016-11-20 DIAGNOSIS — Z3202 Encounter for pregnancy test, result negative: Secondary | ICD-10-CM

## 2016-11-20 DIAGNOSIS — S63641A Sprain of metacarpophalangeal joint of right thumb, initial encounter: Secondary | ICD-10-CM

## 2016-11-20 LAB — POCT PREGNANCY, URINE: PREG TEST UR: NEGATIVE

## 2016-11-20 MED ORDER — TRAMADOL HCL 50 MG PO TABS: 50.0000 mg | ORAL_TABLET | Freq: Four times a day (QID) | ORAL | 0 refills | Status: DC | PRN

## 2016-11-20 MED ORDER — NAPROXEN 500 MG PO TABS: 500.0000 mg | ORAL_TABLET | Freq: Two times a day (BID) | ORAL | 0 refills | Status: DC

## 2016-11-20 MED ORDER — ONDANSETRON HCL 4 MG PO TABS: 4.0000 mg | ORAL_TABLET | Freq: Three times a day (TID) | ORAL | 0 refills | Status: DC | PRN

## 2016-11-20 NOTE — Discharge Instructions (Signed)
I think that you may have sprained the ulnar collateral ligament of your thumb, so you will need to see a hand surgeon if not better in a week to 10 days. Take the Aleve with 1 g of Tylenol twice a day.
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
## 2016-11-20 NOTE — ED Notes (Signed)
Small right thumb spica applied.  Pt stated understanding of how to tighten and loosen the splint.  Directions for splint sent home with pt.

## 2017-06-04 ENCOUNTER — Emergency Department (HOSPITAL_COMMUNITY)
Admission: EM | Admit: 2017-06-04 | Discharge: 2017-06-04 | Disposition: A | Discharge status: Home / Self Care | Payer: Medicaid Other | Attending: Emergency Medicine | Admitting: Emergency Medicine

## 2017-06-04 ENCOUNTER — Emergency Department (HOSPITAL_COMMUNITY): Payer: Medicaid Other

## 2017-06-04 ENCOUNTER — Encounter (HOSPITAL_COMMUNITY): Payer: Self-pay

## 2017-06-04 DIAGNOSIS — R002 Palpitations: Secondary | ICD-10-CM

## 2017-06-04 DIAGNOSIS — Z79899 Other long term (current) drug therapy: Secondary | ICD-10-CM | POA: Insufficient documentation

## 2017-06-04 DIAGNOSIS — F17210 Nicotine dependence, cigarettes, uncomplicated: Secondary | ICD-10-CM | POA: Insufficient documentation

## 2017-06-04 DIAGNOSIS — Z91040 Latex allergy status: Secondary | ICD-10-CM | POA: Insufficient documentation

## 2017-06-04 LAB — I-STAT TROPONIN, ED: TROPONIN I, POC: 0 ng/mL (ref 0.00–0.08)

## 2017-06-04 LAB — CBC
HCT: 36.7 % (ref 36.0–46.0)
HEMOGLOBIN: 12.4 g/dL (ref 12.0–15.0)
MCH: 31.4 pg (ref 26.0–34.0)
MCHC: 33.8 g/dL (ref 30.0–36.0)
MCV: 92.9 fL (ref 78.0–100.0)
PLATELETS: 205 10*3/uL (ref 150–400)
RBC: 3.95 MIL/uL (ref 3.87–5.11)
RDW: 13.6 % (ref 11.5–15.5)
WBC: 8.4 10*3/uL (ref 4.0–10.5)

## 2017-06-04 LAB — BASIC METABOLIC PANEL
ANION GAP: 7 (ref 5–15)
BUN: 8 mg/dL (ref 6–20)
CALCIUM: 8.9 mg/dL (ref 8.9–10.3)
CHLORIDE: 106 mmol/L (ref 101–111)
CO2: 23 mmol/L (ref 22–32)
Creatinine, Ser: 0.59 mg/dL (ref 0.44–1.00)
GFR calc non Af Amer: >60 mL/min (ref >60)
Glucose, Bld: 82 mg/dL (ref 65–99)
Potassium: 4 mmol/L (ref 3.5–5.1)
SODIUM: 136 mmol/L (ref 135–145)

## 2017-06-04 LAB — I-STAT BETA HCG BLOOD, ED (MC, WL, AP ONLY): I-stat hCG, quantitative: <5 m[IU]/mL (ref <5)

## 2017-06-04 NOTE — ED Notes (Signed)
Called X-Ray--Pt not there nor replying when called to be roomed x1.

## 2017-06-04 NOTE — ED Provider Notes (Signed)
[facility] EMERGENCY DEPARTMENT Provider Note   CSN: [id number] Arrival date & time: 06/04/17  1500   History   Chief Complaint Chief Complaint  Patient presents with  . palpitations/shoulder pain    HPI Regina Howell is a 37 y.o. female.  HPI   37 year old female presents today with palpitations.  Patient notes long-standing history of anxiety, taking Vistaril, risperidone, Prozac.  Patient notes that she recently took a new position at [employer] and management causing more stress, she reports episodes of racing heart, chest tightness and difficulty breathing, these are short-lived and resolved spontaneously.  Patient reports she had an episode today.  Recently patient is asymptomatic.  She denies any chest pain, fever, cough.  No history of heart problems in the past.  Patient reports she does smoke.  Patient reports she feels a sensation of her heart beating from time to time.   Past Medical History:  Diagnosis Date  . Bowel obstruction (HCC)   . Diabetes mellitus    resolved after having daughter  . Mental health disorder   . Ulcerative colitis (HCC)     There are no active problems to display for this patient.  History reviewed. No pertinent surgical history.  OB History    No data available      Home Medications    Prior to Admission medications   Medication Sig Start Date End Date Taking? Authorizing Provider  FLUoxetine (PROZAC) 10 MG tablet Take 20 mg by mouth daily.   Yes [provider]  Multiple Vitamin (MULTIVITAMIN WITH MINERALS) TABS tablet Take 1 tablet by mouth daily.   Yes [provider]  ranitidine (ACID REDUCER) 150 MG tablet Take 150 mg by mouth daily as needed for heartburn.   Yes [provider]  risperiDONE (RISPERDAL) 0.25 MG tablet Take 0.25 mg by mouth daily.    Yes [provider]    Family History No family history on file.  Social History Social History   Tobacco Use  .  Smoking status: Current Every Day Smoker    Packs/day: 0.33    Types: Cigarettes  . Smokeless tobacco: Never Used  Substance Use Topics  . Alcohol use: No  . Drug use: No     Allergies   Vicodin [hydrocodone-acetaminophen] and Latex   Review of Systems Review of Systems  All other systems reviewed and are negative.   Physical Exam Updated Vital Signs BP 104/61 (BP Location: Right Arm)   Pulse (!) 59   Temp 99.1 F (37.3 C) (Oral)   Resp 14   SpO2 100%   Physical Exam  Constitutional: She is oriented to person, place, and time. She appears well-developed and well-nourished.  HENT:  Head: Normocephalic and atraumatic.  Eyes: Conjunctivae are normal. Pupils are equal, round, and reactive to light. Right eye exhibits no discharge. Left eye exhibits no discharge. No scleral icterus.  Neck: Normal range of motion. No JVD present. No tracheal deviation present.  Cardiovascular: Normal rate, regular rhythm, normal heart sounds and intact distal pulses.  Pulmonary/Chest: Effort normal. No stridor. No respiratory distress. She has no wheezes. She has no rales. She exhibits no tenderness.  Musculoskeletal: She exhibits no edema.  Neurological: She is alert and oriented to person, place, and time. Coordination normal.  Psychiatric: She has a normal mood and affect. Her behavior is normal. Judgment and thought content normal.  Nursing note and vitals reviewed.    ED Treatments / Results  Labs (all labs ordered are  listed, but only abnormal results are displayed) Labs Reviewed  BASIC METABOLIC PANEL  CBC  I-STAT TROPONIN, ED  I-STAT BETA HCG BLOOD, ED ([facility], [facility], [facility] ONLY)    EKG  EKG Interpretation None       Radiology Dg Chest 2 View  Result Date: 06/04/2017 CLINICAL DATA:  Chest pain and shortness of breath EXAM: CHEST - 2 VIEW COMPARISON:  January 07, 2004 FINDINGS: There is mild central peribronchial thickening. There is no edema or consolidation. Heart size and  pulmonary vascularity are normal. No pneumothorax. No adenopathy. No bone lesions. IMPRESSION: Mild central peribronchial thickening. Suspect a degree of mild chronic bronchitis. No edema or consolidation. Electronically Signed   By: Bretta BangWilliam  Woodruff III M.D.   On: 06/04/2017 15:53    Procedures Procedures (including critical care time)  Medications Ordered in ED Medications - No data to display   Initial Impression / Assessment and Plan / ED Course  I have reviewed the triage vital signs and the nursing notes.  Pertinent labs & imaging results that were available during my care of the patient were reviewed by me and considered in my medical decision making (see chart for details).      Final Clinical Impressions(s) / ED Diagnoses   Final diagnoses:  Palpitations   Labs: I stat beta hcg, I stat trop, bmp, cbc  Imaging: DG chest 2 view -peribronchial thickening  Consults:  Therapeutics:  Discharge Meds:   Assessment/Plan: 37 year old female presents today with complaints of palpitations.  Well-appearing in no acute distress.  She has no significant cardiac history low suspicion for cardiac arrhythmia.  Reassuring EKG chest x-ray and troponin here.  Patient symptoms likely related to stress.  She was counseled on stress reduction outpatient follow-up and strict return precautions.  She verbalized understanding and agreement to today's plan had no further questions or concerns at time of discharge.   ED Discharge Orders    None       Eyvonne MechanicHedges, Demarie Uhlig, PA-C 06/05/17 1344    Mabe, Latanya MaudlinMartha L, MD 06/05/17 [phone]

## 2017-06-04 NOTE — Discharge Instructions (Signed)
Please read attached information. If you experience any new or worsening signs or symptoms please return to the emergency room for evaluation. Please follow-up with your primary care provider or specialist as discussed. Please use medication prescribed only as directed and discontinue taking if you have any concerning signs or symptoms.   °

## 2017-06-04 NOTE — ED Notes (Signed)
Unable to locate x 3

## 2017-06-04 NOTE — ED Triage Notes (Signed)
Patient complains of intermittent left sided neck and arm pain that has now progressed to palpitations. On assessment alert and oriented, NAD

## 2017-12-19 ENCOUNTER — Encounter (HOSPITAL_COMMUNITY): Payer: Self-pay

## 2017-12-19 ENCOUNTER — Other Ambulatory Visit: Payer: Self-pay

## 2017-12-19 ENCOUNTER — Emergency Department (HOSPITAL_COMMUNITY)
Admission: EM | Admit: 2017-12-19 | Discharge: 2017-12-19 | Disposition: A | Discharge status: Home / Self Care | Payer: Medicaid Other | Attending: Emergency Medicine | Admitting: Emergency Medicine

## 2017-12-19 DIAGNOSIS — Z79899 Other long term (current) drug therapy: Secondary | ICD-10-CM | POA: Insufficient documentation

## 2017-12-19 DIAGNOSIS — F17210 Nicotine dependence, cigarettes, uncomplicated: Secondary | ICD-10-CM | POA: Insufficient documentation

## 2017-12-19 DIAGNOSIS — N12 Tubulo-interstitial nephritis, not specified as acute or chronic: Secondary | ICD-10-CM

## 2017-12-19 DIAGNOSIS — Z91040 Latex allergy status: Secondary | ICD-10-CM | POA: Insufficient documentation

## 2017-12-19 DIAGNOSIS — N10 Acute tubulo-interstitial nephritis: Secondary | ICD-10-CM | POA: Insufficient documentation

## 2017-12-19 LAB — URINALYSIS, ROUTINE W REFLEX MICROSCOPIC
Bilirubin Urine: NEGATIVE
GLUCOSE, UA: NEGATIVE mg/dL
KETONES UR: NEGATIVE mg/dL
NITRITE: POSITIVE — AB
PROTEIN: 30 mg/dL — AB
SPECIFIC GRAVITY, URINE: 1.017 (ref 1.005–1.030)
pH: 6 (ref 5.0–8.0)

## 2017-12-19 MED ORDER — KETOROLAC TROMETHAMINE 15 MG/ML IJ SOLN
15.0000 mg | Freq: Once | INTRAMUSCULAR | Status: AC
Start: 2017-12-19 — End: 2017-12-19
  Administered 2017-12-19: 15 mg via INTRAMUSCULAR
  Filled 2017-12-19: qty 1

## 2017-12-19 MED ORDER — SULFAMETHOXAZOLE-TRIMETHOPRIM 800-160 MG PO TABS: 1.0000 | ORAL_TABLET | Freq: Two times a day (BID) | ORAL | 0 refills | Status: DC

## 2017-12-19 MED ORDER — CEPHALEXIN 500 MG PO CAPS: 500.0000 mg | ORAL_CAPSULE | Freq: Three times a day (TID) | ORAL | 0 refills | Status: DC

## 2017-12-19 MED ORDER — CEPHALEXIN 500 MG PO CAPS: 500.0000 mg | ORAL_CAPSULE | Freq: Three times a day (TID) | ORAL | 0 refills | Status: AC

## 2017-12-19 MED ORDER — METHOCARBAMOL 500 MG PO TABS
750.0000 mg | ORAL_TABLET | Freq: Once | ORAL | Status: AC
  Administered 2017-12-19: 750 mg via ORAL
  Filled 2017-12-19: qty 2

## 2017-12-19 NOTE — ED Provider Notes (Addendum)
[facility]-EMERGENCY DEPT Provider Note   CSN: [id number] Arrival date & time: 12/19/17  0245     History   Chief Complaint Chief Complaint  Patient presents with  . Back Pain    HPI Regina Howell is a 37 y.o. female.  37 y/o female with a PMH of UC presents to the ED with a chief complaint of back pain x 1 week. Patient describes mainly along her right flank, she describes it as a constant knot feeling with no radiation.She reports lying on her right side makes the symptoms bearable. She has tried ice pack, heating pad, Biofreeze, and aleve x2 but reports no relieve in symptoms. She denies any fever, IV drug use, urinary symptoms, bowel or bladder incontinence, gynecological complaints.      Past Medical History:  Diagnosis Date  . Bowel obstruction (HCC)   . Diabetes mellitus    resolved after having daughter  . Mental health disorder   . Ulcerative colitis (HCC)     There are no active problems to display for this patient.   History reviewed. No pertinent surgical history.   OB History   None      Home Medications    Prior to Admission medications   Medication Sig Start Date End Date Taking? Authorizing Provider  Multiple Vitamin (MULTIVITAMIN WITH MINERALS) TABS tablet Take 1 tablet by mouth daily.   Yes [provider]  naproxen sodium (ALEVE) 220 MG tablet Take 440 mg by mouth daily as needed (pain).   Yes [provider]  OLANZapine (ZYPREXA) 5 MG tablet Take 5 mg by mouth at bedtime.   Yes [provider]  sertraline (ZOLOFT) 50 MG tablet Take 75 mg by mouth daily.   Yes [provider]  cephALEXin (KEFLEX) 500 MG capsule Take 1 capsule (500 mg total) by mouth 3 (three) times daily for 14 days. 12/19/17 01/02/18  Claude Manges, PA-C    Family History History reviewed. No pertinent family history.  Social History Social History   Tobacco Use  . Smoking status: Current Every Day Smoker   Packs/day: 0.33    Types: Cigarettes  . Smokeless tobacco: Never Used  Substance Use Topics  . Alcohol use: No  . Drug use: No     Allergies   Vicodin [hydrocodone-acetaminophen] and Latex   Review of Systems Review of Systems  Constitutional: Negative for chills and fever.  HENT: Negative for sore throat.   Respiratory: Negative for shortness of breath.   Cardiovascular: Negative for chest pain and palpitations.  Gastrointestinal: Negative for abdominal pain, diarrhea, nausea and vomiting.  Genitourinary: Positive for flank pain. Negative for decreased urine volume, difficulty urinating, dysuria, frequency, hematuria and urgency.  Musculoskeletal: Positive for back pain and myalgias.  Neurological: Negative for light-headedness.  All other systems reviewed and are negative.    Physical Exam Updated Vital Signs BP 91/66 (BP Location: Left Arm)   Pulse 70   Temp 99 F (37.2 C) (Oral)   Resp 16   SpO2 100%   Physical Exam  Constitutional: She is oriented to person, place, and time. She appears well-developed and well-nourished.  HENT:  Head: Normocephalic and atraumatic.  Neck: Normal range of motion. Neck supple.  Cardiovascular: Normal heart sounds.  Pulmonary/Chest: Breath sounds normal.  Abdominal: Soft. Bowel sounds are normal. There is no tenderness.  Musculoskeletal: She exhibits tenderness.       Thoracic back: She exhibits pain. She exhibits no tenderness and no bony tenderness.  Lumbar back: She exhibits no tenderness, no bony tenderness, no swelling, no edema, no deformity, no laceration, no pain and no spasm.       Back:  Right flank pain reproducible with palpation along, no CVA tenderness. No swelling or edema noted to area.   Neurological: She is alert and oriented to person, place, and time.  Skin: Skin is warm and dry.  Nursing note and vitals reviewed.    ED Treatments / Results  Labs (all labs ordered are listed, but only abnormal  results are displayed) Labs Reviewed  URINALYSIS, ROUTINE W REFLEX MICROSCOPIC - Abnormal; Notable for the following components:      Result Value   Color, Urine AMBER (*)    APPearance CLOUDY (*)    Hgb urine dipstick SMALL (*)    Protein, ur 30 (*)    Nitrite POSITIVE (*)    Leukocytes, UA SMALL (*)    Bacteria, UA MANY (*)    All other components within normal limits    EKG None  Radiology No results found.  Procedures Procedures (including critical care time)  Medications Ordered in ED Medications  methocarbamol (ROBAXIN) tablet 750 mg (750 mg Oral Given 12/19/17 0729)  ketorolac (TORADOL) 15 MG/ML injection 15 mg (15 mg Intramuscular Given 12/19/17 0728)     Initial Impression / Assessment and Plan / ED Course  I have reviewed the triage vital signs and the nursing notes.  Pertinent labs & imaging results that were available during my care of the patient were reviewed by me and considered in my medical decision making (see chart for details).     Presents with chronic history of back pain and muscle spasms.  Patient has tried multiple methods for relief but states no relief in pain this time around.  Patient states muscle relaxers make her drowsy but as stated this is a side effect of the medication.  Patient has arrived and will come pick her up I will provide her with muscle relaxer along with some Toradol for her back spasm as patient is laying in bed flat pain. Upon examination there is pain with palpation right below the right flank region.  Will obtain UA to rule out any kidney stone disease, patient currently denies of any urinary symptoms.  Blood pressures have been soft during ED visit with the lowest one at 88/49 however I have personally reviewed patient's chart and see that her baseline is around 102/72 I asked for a blood pressure recheck which showed 91/66 patient denies any dizziness, lightheadedness, at this time this is consistent with patient's  baseline.  UA showed small Hgb present, nitrite positive along with many bacteria present. I have discussed patient with Dr. Rhunette Croft who evaluated patient. Patient reports some urinary frequency and chills, at this time will treat patient for pyelonephritis,consistent with her urinary symptoms and chills. I will prescribe patient antibiotics to treat her infection. Patient's return precautions discussed at length she knows to return to the ED if she experiences any fever, worsening symptoms.   Final Clinical Impressions(s) / ED Diagnoses   Final diagnoses:  Pyelonephritis    DC rx: Keflex 500mg  TID x 14 days.    Claude Manges, PA-C 12/19/17 1111    Claude Manges, PA-C 12/19/17 1114    Derwood Kaplan, MD 12/20/17 1643

## 2017-12-19 NOTE — ED Triage Notes (Signed)
Pt reports R flank pain x 1 week. Pain is at just one spot in her R lower back. She denies burning with urination. Denies vaginal discharge. Pain does not radiate. She reports that she has a history of back pain. A&Ox4. Ambulatory with pain.

## 2017-12-19 NOTE — Discharge Instructions (Addendum)
I have prescribed antibiotics for your kidney infection.Please take 1 pill three times a day for the next 14 days.If you experience any fever, chills, or worsening symptoms please return to the ED for reevaluation.

## 2019-02-24 IMAGING — CR DG CHEST 2V
2 series · 2 of 2 positions shown · non-contrast
Comparison: January 07, 2004

CLINICAL DATA: Chest pain and shortness of breath

EXAM:
CHEST - 2 VIEW

[chest pa]
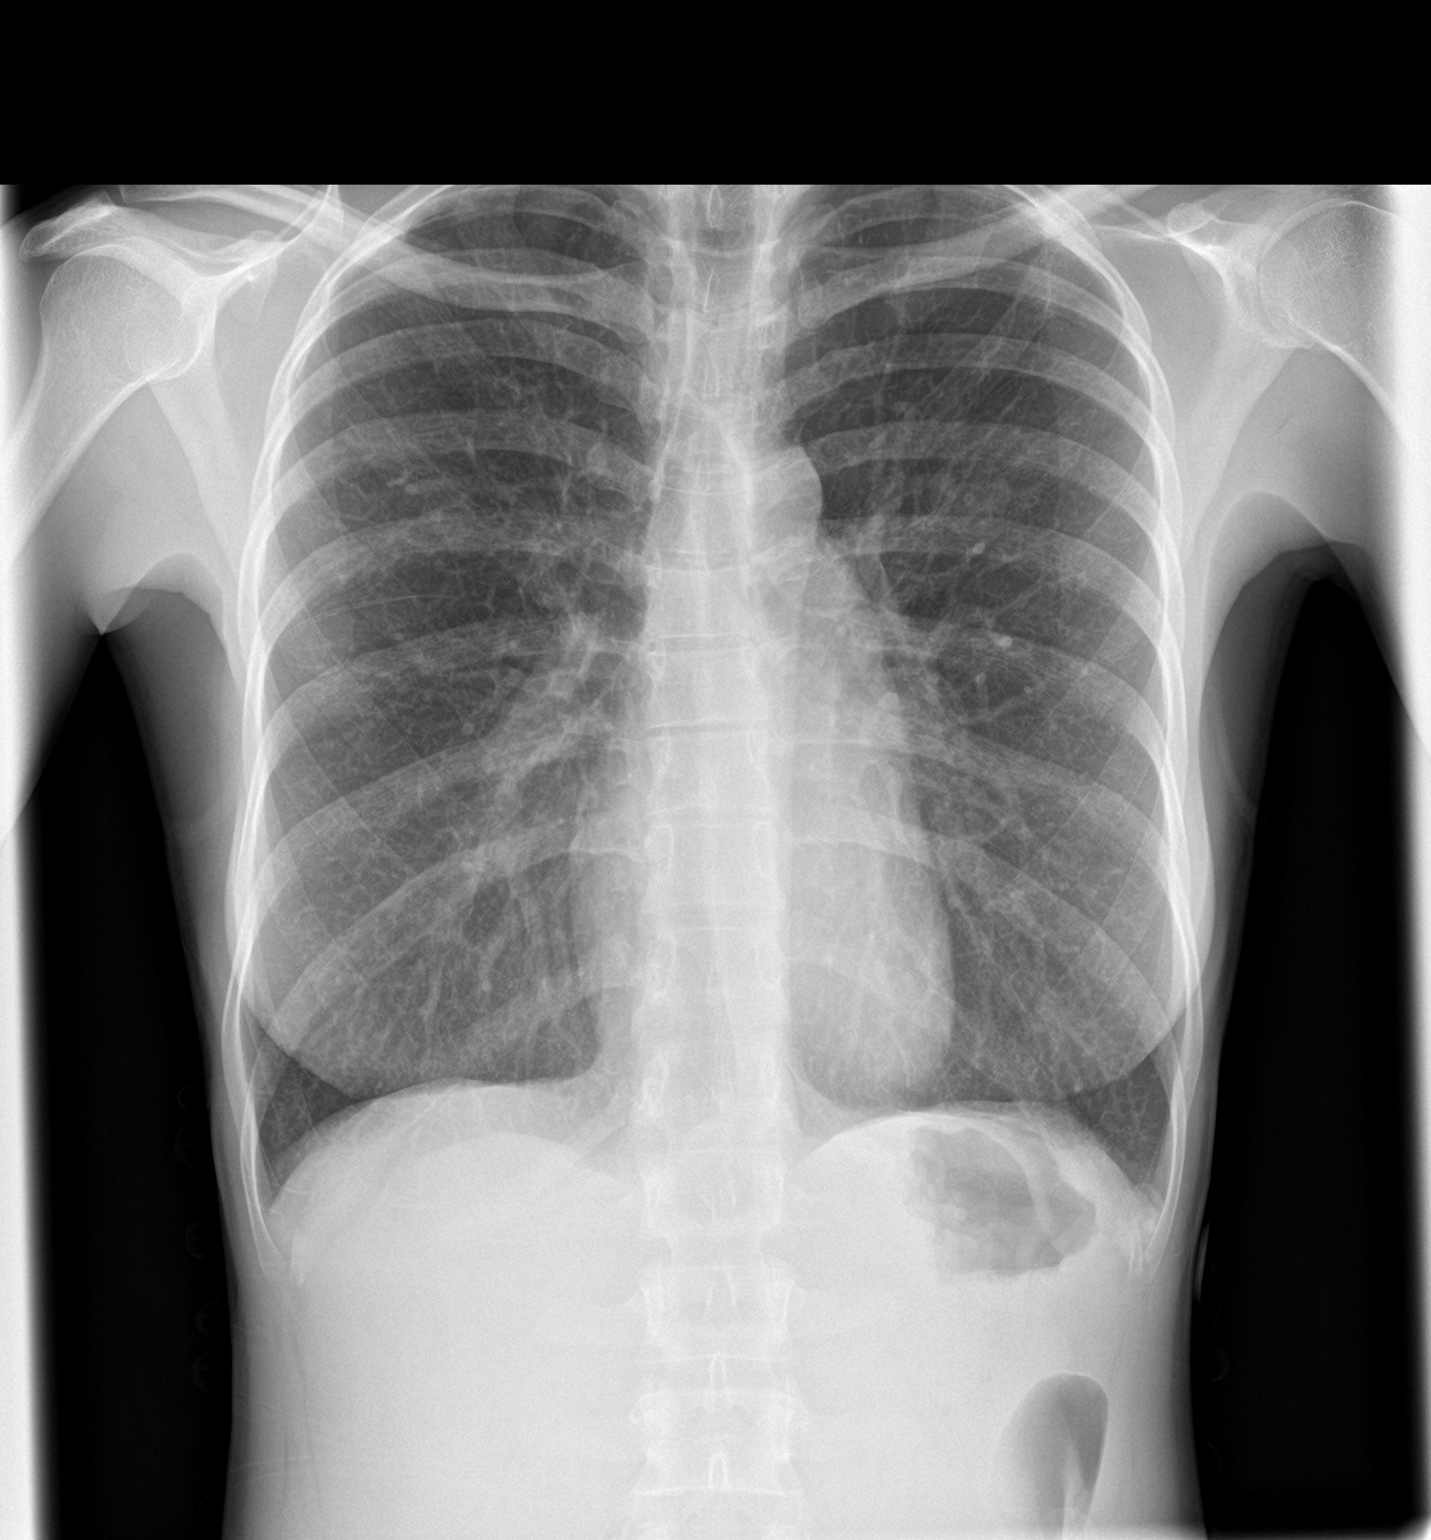

[chest lat]
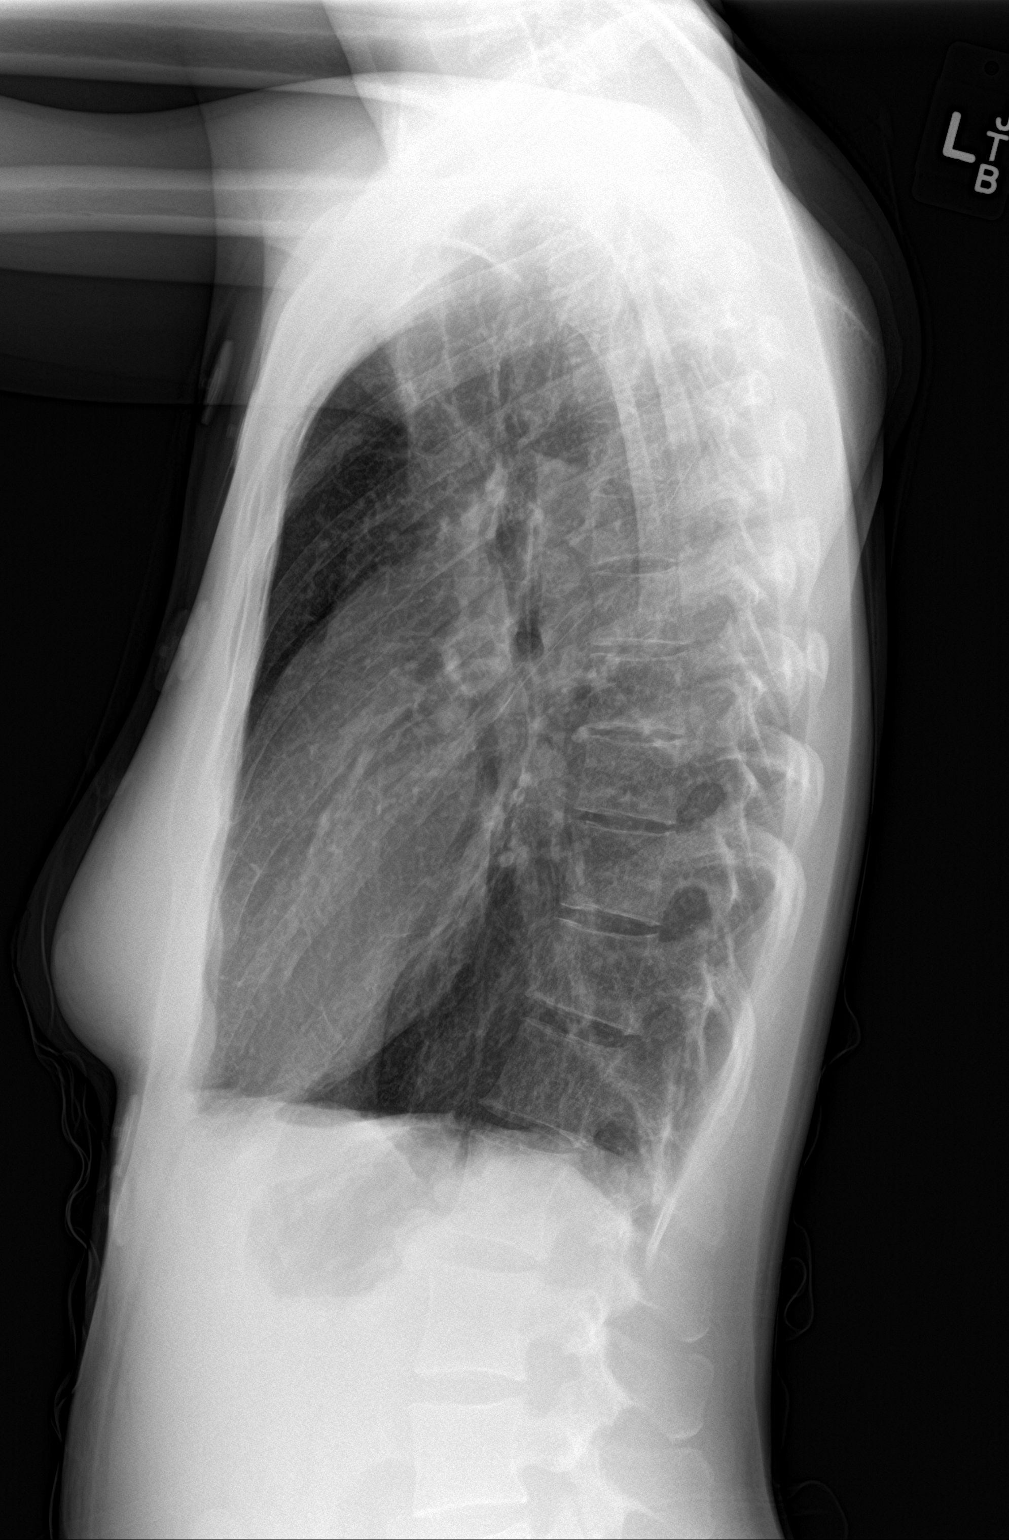

[2 of 2 positions shown; findings below may reference images not displayed]

FINDINGS: There is mild central peribronchial thickening. There is no edema or
consolidation. Heart size and pulmonary vascularity are normal. No
pneumothorax. No adenopathy. No bone lesions.
IMPRESSION: Mild central peribronchial thickening. Suspect a degree of mild
chronic bronchitis. No edema or consolidation.

## 2019-05-12 ENCOUNTER — Ambulatory Visit (HOSPITAL_COMMUNITY)
Admission: EM | Admit: 2019-05-12 | Discharge: 2019-05-12 | Disposition: A | Discharge status: Home / Self Care | Payer: Self-pay | Attending: Family Medicine | Admitting: Family Medicine

## 2019-05-12 ENCOUNTER — Encounter (HOSPITAL_COMMUNITY): Payer: Self-pay

## 2019-05-12 ENCOUNTER — Other Ambulatory Visit: Payer: Self-pay

## 2019-05-12 DIAGNOSIS — M5412 Radiculopathy, cervical region: Secondary | ICD-10-CM

## 2019-05-12 DIAGNOSIS — M542 Cervicalgia: Secondary | ICD-10-CM

## 2019-05-12 MED ORDER — KETOROLAC TROMETHAMINE 30 MG/ML IJ SOLN
INTRAMUSCULAR | Status: AC
  Filled 2019-05-12: qty 1

## 2019-05-12 MED ORDER — PREDNISONE 10 MG PO TABS: ORAL_TABLET | ORAL | 0 refills | Status: DC

## 2019-05-12 MED ORDER — KETOROLAC TROMETHAMINE 30 MG/ML IJ SOLN
30.0000 mg | Freq: Once | INTRAMUSCULAR | Status: AC
  Administered 2019-05-12: 10:00:00 30 mg via INTRAMUSCULAR

## 2019-05-12 MED ORDER — TIZANIDINE HCL 4 MG PO TABS: 4.0000 mg | ORAL_TABLET | Freq: Four times a day (QID) | ORAL | 0 refills | Status: DC | PRN

## 2019-05-12 NOTE — Discharge Instructions (Signed)
Use ice or heat to area for pain relief Take prednisone as directed.  Take all of day 1 today Take tizanidine as needed, muscle relaxer.  This is useful to take before you sleep. Rest for a few days.  Avoid heavy lifting and overhead reach Follow-up if you fail to improve

## 2019-05-12 NOTE — ED Triage Notes (Signed)
Pt c/o neck and left arm pain x3-4 months. States pain on left neck/trapezius area and upper arm pain. Arm pain improved with raising arm over her head with elbow bent. Pain increases in deltoid/shoulder area with arm straight down. +2 radial pulse; neurovascular intact.   Pt states she was just prescribed a new mood stabilizer yesterday and cannot recall name, but hasn't pick Rx yet.

## 2019-05-12 NOTE — ED Provider Notes (Signed)
[facility]    CSN: [id number] Arrival date & time: 05/12/19  8921      History   Chief Complaint Chief Complaint  Patient presents with  . Arm Pain  . Neck Pain    HPI Regina Howell is a 39 y.o. female.   HPI Patient states she has had pain in her neck and left arm intermittently for 3 or 4 months.  No accident.  No injury.  No overuse.  She states that it comes and goes.  Today it severely painful.  She states she is unable to go to work.  She almost went to the emergency room last night, however there was a long wait.  The pain is in her left neck, radiates into the left shoulder and upper arm, at times causes pain and numbness going to fingers.  She cannot recall which fingers are affected.  She states that she is able to use her arm, but it increases the pain after her work shift.  She has pain with certain neck movements.  No prior problems with her neck. Past medical history includes ulcerative colitis.  Currently in active.  Not on medication. Problem list also notes mental health disorder.  Patient states that she was prescribed new mood stabilizer yesterday but does not recall the name.  Has not picked it up at the pharmacy.  Is not currently taking any medicine for depression or bipolar illness. Past Medical History:  Diagnosis Date  . Bowel obstruction (HCC)   . Diabetes mellitus    resolved after having daughter  . Mental health disorder   . Ulcerative colitis (HCC)     There are no problems to display for this patient.   History reviewed. No pertinent surgical history.  OB History   No obstetric history on file.      Home Medications    Prior to Admission medications   Medication Sig Start Date End Date Taking? Authorizing Provider  sertraline (ZOLOFT) 50 MG tablet Take 75 mg by mouth daily.  05/12/19 Yes [provider]  predniSONE (DELTASONE) 10 MG tablet Take 6 tabs by mouth daily  for 2 days, then 5 tabs for 2 days, then 4 tabs  for 2 days, then 3 tabs for 2 days, 2 tabs for 2 days, then 1 tab by mouth daily for 2 days 05/12/19   Eustace Moore, MD  tiZANidine (ZANAFLEX) 4 MG tablet Take 1-2 tablets (4-8 mg total) by mouth every 6 (six) hours as needed for muscle spasms. 05/12/19   Eustace Moore, MD  mirtazapine (REMERON) 15 MG tablet Take 15 mg by mouth at bedtime. 04/02/19 05/12/19  [provider]  OLANZapine (ZYPREXA) 5 MG tablet Take 5 mg by mouth at bedtime.  05/12/19  [provider]    Family History Family History  Problem Relation Age of Onset  . Diabetes Mother   . Healthy Father     Social History Social History   Tobacco Use  . Smoking status: Current Every Day Smoker    Packs/day: 0.33    Types: Cigarettes  . Smokeless tobacco: Never Used  Substance Use Topics  . Alcohol use: No  . Drug use: No     Allergies   Vicodin [hydrocodone-acetaminophen] and Latex   Review of Systems Review of Systems  Musculoskeletal: Positive for neck pain and neck stiffness.  Neurological: Positive for numbness and headaches.     Physical Exam Triage Vital Signs ED Triage Vitals  Enc  Vitals Group     BP 05/12/19 0912 109/62     Pulse Rate 05/12/19 0912 77     Resp 05/12/19 0912 16     Temp 05/12/19 0912 98.2 F (36.8 C)     Temp Source 05/12/19 0912 Oral     SpO2 05/12/19 0912 100 %     Weight --      Height --      Head Circumference --      Peak Flow --      Pain Score 05/12/19 0913 7     Pain Loc --      Pain Edu? --      Excl. in [place]? --    No data found.  Updated Vital Signs BP 109/62 (BP Location: Left Arm)   Pulse 77   Temp 98.2 F (36.8 C) (Oral)   Resp 16   LMP 05/06/2019   SpO2 100%       Physical Exam Constitutional:      General: She is in acute distress.     Appearance: She is well-developed and normal weight.     Comments: Appears uncomfortable.  Sits with left shoulder slightly raised.  HENT:     Head: Normocephalic and atraumatic.      Mouth/Throat:     Comments: Mask in place Eyes:     Conjunctiva/sclera: Conjunctivae normal.     Pupils: Pupils are equal, round, and reactive to light.  Neck:     Comments: Tenderness in left trapezius muscle.  Resistance to right lateral movement.  Can flex and extend. Cardiovascular:     Rate and Rhythm: Normal rate.  Pulmonary:     Effort: Pulmonary effort is normal. No respiratory distress.  Musculoskeletal:        General: Normal range of motion.     Cervical back: Normal range of motion. Tenderness present.  Skin:    General: Skin is warm and dry.  Neurological:     General: No focal deficit present.     Mental Status: She is alert.     Sensory: Sensory deficit present.     Motor: Weakness present.     Gait: Gait abnormal.     Deep Tendon Reflexes: Reflexes abnormal.  Psychiatric:        Mood and Affect: Mood normal.        Behavior: Behavior normal.   Upper extremity examination is symmetric   UC Treatments / Results  Labs (all labs ordered are listed, but only abnormal results are displayed) Labs Reviewed - No data to display  EKG   Radiology No results found.  Procedures Procedures (including critical care time)  Medications Ordered in UC Medications  ketorolac (TORADOL) 30 MG/ML injection 30 mg (30 mg Intramuscular Given 05/12/19 1024)    Initial Impression / Assessment and Plan / UC Course  I have reviewed the triage vital signs and the nursing notes.  Pertinent labs & imaging results that were available during my care of the patient were reviewed by me and considered in my medical decision making (see chart for details).     Cervical radicular pain.  Muscle tenderness in the upper body of the trapezius.  We will treat conservatively.  Will avoid anti-inflammatory medications because of her history of ulcerative colitis. Final Clinical Impressions(s) / UC Diagnoses   Final diagnoses:  Muscle pain, cervical  Cervical radiculitis     Discharge  Instructions     Use ice or heat to area for pain  relief Take prednisone as directed.  Take all of day 1 today Take tizanidine as needed, muscle relaxer.  This is useful to take before you sleep. Rest for a few days.  Avoid heavy lifting and overhead reach Follow-up if you fail to improve    ED Prescriptions    Medication Sig Dispense Auth. Provider   predniSONE (DELTASONE) 10 MG tablet Take 6 tabs by mouth daily  for 2 days, then 5 tabs for 2 days, then 4 tabs for 2 days, then 3 tabs for 2 days, 2 tabs for 2 days, then 1 tab by mouth daily for 2 days 42 tablet Eustace Moore, MD   tiZANidine (ZANAFLEX) 4 MG tablet Take 1-2 tablets (4-8 mg total) by mouth every 6 (six) hours as needed for muscle spasms. 21 tablet Eustace Moore, MD     PDMP not reviewed this encounter.   Eustace Moore, MD 05/12/19 1047

## 2019-05-19 ENCOUNTER — Encounter (HOSPITAL_COMMUNITY): Payer: Self-pay | Admitting: Emergency Medicine

## 2019-05-19 ENCOUNTER — Other Ambulatory Visit: Payer: Self-pay

## 2019-05-19 ENCOUNTER — Emergency Department (HOSPITAL_COMMUNITY): Payer: Self-pay

## 2019-05-19 ENCOUNTER — Emergency Department (HOSPITAL_COMMUNITY)
Admission: EM | Admit: 2019-05-19 | Discharge: 2019-05-19 | Disposition: A | Discharge status: Home / Self Care | Payer: Self-pay | Attending: Emergency Medicine | Admitting: Emergency Medicine

## 2019-05-19 DIAGNOSIS — M542 Cervicalgia: Secondary | ICD-10-CM

## 2019-05-19 DIAGNOSIS — M4802 Spinal stenosis, cervical region: Secondary | ICD-10-CM | POA: Insufficient documentation

## 2019-05-19 DIAGNOSIS — Z79899 Other long term (current) drug therapy: Secondary | ICD-10-CM | POA: Insufficient documentation

## 2019-05-19 DIAGNOSIS — Z91040 Latex allergy status: Secondary | ICD-10-CM | POA: Insufficient documentation

## 2019-05-19 DIAGNOSIS — F17210 Nicotine dependence, cigarettes, uncomplicated: Secondary | ICD-10-CM | POA: Insufficient documentation

## 2019-05-19 DIAGNOSIS — M5412 Radiculopathy, cervical region: Secondary | ICD-10-CM | POA: Insufficient documentation

## 2019-05-19 DIAGNOSIS — M62838 Other muscle spasm: Secondary | ICD-10-CM | POA: Insufficient documentation

## 2019-05-19 MED ORDER — LIDOCAINE 5 % EX PTCH: 1.0000 | MEDICATED_PATCH | CUTANEOUS | 0 refills | Status: DC

## 2019-05-19 MED ORDER — PREDNISONE 50 MG PO TABS: 50.0000 mg | ORAL_TABLET | Freq: Every day | ORAL | 0 refills | Status: AC

## 2019-05-19 MED ORDER — CYCLOBENZAPRINE HCL 10 MG PO TABS: 10.0000 mg | ORAL_TABLET | Freq: Two times a day (BID) | ORAL | 0 refills | Status: DC | PRN

## 2019-05-19 NOTE — Discharge Instructions (Signed)
Your work-up today revealed evidence of degenerative cervical spine disease as well of some foraminal stenosis on the left likely contributing to the pain going down from your neck to your arm.  This also could likely contribute to the numbness, tingling, and the occasional weakness as we discovered.  There was no evidence of cord injury or compression.  There is no fracture seen.  We feel you are safe for discharge home to use a new burst of the steroids, muscle relaxants, numbing patch, and follow-up with the neurosurgery team.  If any symptoms change or worsen, please return to nearest emergency department.

## 2019-05-19 NOTE — ED Triage Notes (Signed)
Pt was told she had a pinched nerve at UC. Pain has not improved with tizanidine. Neck pain and left arm tingling for a year that has worsened over the past 3-4 months.

## 2019-05-19 NOTE — ED Provider Notes (Signed)
[facility] EMERGENCY DEPARTMENT Provider Note   CSN: [id number] Arrival date & time: 05/19/19  2297     History No chief complaint on file.   Regina Howell is a 39 y.o. female.  The history is provided by the patient and medical records. No language interpreter was used.  Neck Injury This is a new problem. The current episode started more than 1 week ago. The problem occurs constantly. The problem has been gradually worsening. Pertinent negatives include no chest pain, no abdominal pain, no headaches and no shortness of breath. The symptoms are aggravated by twisting. Nothing relieves the symptoms. She has tried nothing for the symptoms. The treatment provided no relief.       Past Medical History:  Diagnosis Date  . Bowel obstruction (HCC)   . Diabetes mellitus    resolved after having daughter  . Mental health disorder   . Ulcerative colitis (HCC)     There are no problems to display for this patient.   No past surgical history on file.   OB History   No obstetric history on file.     Family History  Problem Relation Age of Onset  . Diabetes Mother   . Healthy Father     Social History   Tobacco Use  . Smoking status: Current Every Day Smoker    Packs/day: 0.33    Types: Cigarettes  . Smokeless tobacco: Never Used  Substance Use Topics  . Alcohol use: No  . Drug use: No    Home Medications Prior to Admission medications   Medication Sig Start Date End Date Taking? Authorizing Provider  predniSONE (DELTASONE) 10 MG tablet Take 6 tabs by mouth daily  for 2 days, then 5 tabs for 2 days, then 4 tabs for 2 days, then 3 tabs for 2 days, 2 tabs for 2 days, then 1 tab by mouth daily for 2 days 05/12/19   Eustace Moore, MD  tiZANidine (ZANAFLEX) 4 MG tablet Take 1-2 tablets (4-8 mg total) by mouth every 6 (six) hours as needed for muscle spasms. 05/12/19   Eustace Moore, MD  mirtazapine (REMERON) 15 MG tablet Take 15 mg by mouth at  bedtime. 04/02/19 05/12/19  [provider]  OLANZapine (ZYPREXA) 5 MG tablet Take 5 mg by mouth at bedtime.  05/12/19  [provider]  sertraline (ZOLOFT) 50 MG tablet Take 75 mg by mouth daily.  05/12/19  [provider]    Allergies    Vicodin [hydrocodone-acetaminophen] and Latex  Review of Systems   Review of Systems  Constitutional: Negative for chills, diaphoresis, fatigue and fever.  HENT: Negative for congestion.   Eyes: Negative for visual disturbance.  Respiratory: Negative for cough, chest tightness, shortness of breath and wheezing.   Cardiovascular: Negative for chest pain.  Gastrointestinal: Negative for abdominal pain, constipation, diarrhea, nausea and vomiting.  Musculoskeletal: Negative for back pain and neck stiffness.  Skin: Negative for wound.  Neurological: Positive for weakness and numbness. Negative for dizziness, light-headedness and headaches.  All other systems reviewed and are negative.   Physical Exam Updated Vital Signs BP 98/71 (BP Location: Right Arm)   Pulse (!) 114   Temp 98.6 F (37 C) (Oral)   Resp 16   LMP 05/06/2019   SpO2 100%   Physical Exam Vitals and nursing note reviewed.  Constitutional:      General: She is not in acute distress.    Appearance: She is well-developed. She is not  ill-appearing, toxic-appearing or diaphoretic.  HENT:     Head: Normocephalic and atraumatic.     Nose: No congestion or rhinorrhea.     Mouth/Throat:     Mouth: Mucous membranes are moist.     Pharynx: No oropharyngeal exudate or posterior oropharyngeal erythema.  Eyes:     General: No visual field deficit.    Extraocular Movements: Extraocular movements intact.     Conjunctiva/sclera: Conjunctivae normal.     Pupils: Pupils are equal, round, and reactive to light.  Neck:     Vascular: No carotid bruit.   Cardiovascular:     Rate and Rhythm: Normal rate and regular rhythm.     Heart sounds: No murmur.  Pulmonary:      Effort: Pulmonary effort is normal. No respiratory distress.     Breath sounds: Normal breath sounds.  Abdominal:     General: Abdomen is flat.     Palpations: Abdomen is soft.     Tenderness: There is no abdominal tenderness. There is no left CVA tenderness or guarding.  Musculoskeletal:        General: Tenderness present.     Cervical back: Neck supple. Tenderness present.     Right lower leg: No edema.     Left lower leg: No edema.  Skin:    General: Skin is warm and dry.     Capillary Refill: Capillary refill takes less than 2 seconds.     Findings: No erythema.  Neurological:     Mental Status: She is alert and oriented to person, place, and time.     Sensory: Sensory deficit present.     Motor: Weakness present.     Coordination: Coordination normal. Finger-Nose-Finger Test normal.     Gait: Gait normal.     Comments: Hoffmann test negative on hands.  Tenderness in left neck and mid neck.  Mild numbness in left hand and left arm with tingling.  Grip strength decreased on left.  Psychiatric:        Mood and Affect: Mood normal.     ED Results / Procedures / Treatments   Labs (all labs ordered are listed, but only abnormal results are displayed) Labs Reviewed - No data to display  EKG None  Radiology MR Cervical Spine Wo Contrast  Result Date: 05/19/2019 CLINICAL DATA:  Severe mid and left neck pain with radicular pain in the left arm as well as numbness and weakness. EXAM: MRI CERVICAL SPINE WITHOUT CONTRAST TECHNIQUE: Multiplanar, multisequence MR imaging of the cervical spine was performed. No intravenous contrast was administered. COMPARISON:  None. FINDINGS: Alignment: Slight reversal of the normal cervical lordosis. No listhesis. Vertebrae: No fracture, suspicious marrow lesion, or evidence of discitis. Degenerative endplate changes asymmetric to the left at C5-6 associated with moderate disc space narrowing. Milder disc space narrowing and degenerative endplate  changes at P3-8. Cord: Normal signal and morphology. Posterior Fossa, vertebral arteries, paraspinal tissues: Unremarkable. Disc levels: C2-3: Negative. C3-4: Negative. C4-5: Mild disc bulging eccentric to the left and uncovertebral spurring without stenosis. C5-6: Broad-based posterior disc osteophyte complex and small superimposed left paracentral disc protrusion result in mild spinal stenosis and moderate left neural foraminal stenosis potentially affecting the left C6 nerve root. C6-7: Minimal disc bulging without stenosis. C7-T1: Minimal disc bulging without stenosis. 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 Electronically Signed   By: Logan Bores M.D.   On: 05/19/2019 10:31  Procedures Procedures (including critical care time)  Medications Ordered in ED Medications - No data to display  ED Course  I have reviewed the triage vital signs and the nursing notes.  Pertinent labs & imaging results that were available during my care of the patient were reviewed by me and considered in my medical decision making (see chart for details).    MDM Rules/Calculators/A&P                      CHARIKA MIKELSON is a 39 y.o. female with a past medical history significant for diabetes, ulcerative colitis, prior bowel obstruction, and mental health disorder who presents with worsening left neck pain with radicular symptoms including numbness and weakness.  Patient reports that she has had some neck pain is been worsening for the last few months acutely worsening over the last few weeks.  She reports that she was seen last week at urgent care and was started on a burst of steroids and muscle relaxants.  She reports it is not improved and is now 10 out of 10 and worsening.  She reports it goes from her left neck all the way down to her fingertips and she has numbness and tingling that comes and goes.  She reports  that she has had some difficulty with grip strength with this.  She denies any known trauma previously.  She reports the pain has been worsening this morning prompting her to seek evaluation.  She has not had any imaging of her neck previously.  She denies fevers, chills, headache, nausea, vomiting, urinary symptoms or GI symptoms.  No recent Covid exposures.  On exam, lungs are clear and chest is nontender.  Abdomen is nontender.  Patient did have mid and left neck tenderness on exam.  She had tenderness going down her left neck towards her shoulder.  She did have some pain with arm movement.  On my exam, she did have slight decrease in grip strength on the left compared to right.  She is right-handed.  She also reported tingling in the left arm compared to the right.  Hoffmann test was negative in both hands.  Symmetric smile with normal sensation in face.  Normal extraocular movements and pupil exam.  Abdomen and chest nontender.  Patient resting otherwise.  As patient has had worsening symptoms of the last week including no improvement with steroids and muscle relaxants, with the new and worsening tingling/numbness and some weakness, I do feel the patient needs an imaging today of her neck.  Low suspicion for MS and I am more concerned about a pinched nerve or cord abnormality.  We will get MRI to further evaluate.  With her lack of fevers, chills, or any documentation of IV drug use, low suspicion for a cervical infection.  Will get a noncontrasted MRI.  Anticipate reassessment after work-up.  MRI showed some cervical disc degeneration and mild spinal stenosis with moderate left neural foraminal stenosis.  Suspect this is because of her radicular symptoms.  No evidence of infection or acute cord compromise or compression.  She will instead be given a burst of steroids, a Lidoderm patch, and a different muscle relaxant.  She will follow up with neurosurgery and PCP.  She agrees with plan of care and  was discharged in good condition after work-up.  Final Clinical Impression(s) / ED Diagnoses Final diagnoses:  Neck pain  Cervical radiculopathy  Foraminal stenosis of cervical region  Muscle spasms of neck  Rx / DC Orders ED Discharge Orders         Ordered    cyclobenzaprine (FLEXERIL) 10 MG tablet  2 times daily PRN     05/19/19 1159    lidocaine (LIDODERM) 5 %  Every 24 hours     05/19/19 1159    predniSONE (DELTASONE) 50 MG tablet  Daily     05/19/19 1159         Clinical Impression: 1. Neck pain   2. Cervical radiculopathy   3. Foraminal stenosis of cervical region   4. Muscle spasms of neck     Disposition: Discharge  Condition: Good  I have discussed the results, Dx and Tx plan with the pt(& family if present). He/she/they expressed understanding and agree(s) with the plan. Discharge instructions discussed at great length. Strict return precautions discussed and pt &/or family have verbalized understanding of the instructions. No further questions at time of discharge.    Discharge Medication List as of 05/19/2019 12:29 PM    START taking these medications   Details  cyclobenzaprine (FLEXERIL) 10 MG tablet Take 1 tablet (10 mg total) by mouth 2 (two) times daily as needed for muscle spasms., Starting Tue 05/19/2019, Print    lidocaine (LIDODERM) 5 % Place 1 patch onto the skin daily. Remove & Discard patch within 12 hours or as directed by MD, Starting Tue 05/19/2019, Print    !! predniSONE (DELTASONE) 50 MG tablet Take 1 tablet (50 mg total) by mouth daily for 5 days., Starting Tue 05/19/2019, Until Sun 05/24/2019, Print     !! - Potential duplicate medications found. Please discuss with provider.      Follow Up: [place], [facility] [address] [place] [address] [phone]        Kody Brandl, Canary Brim, MD 05/19/19 1726

## 2019-05-19 NOTE — ED Notes (Signed)
Pt verbalized understanding of discharge paperwork since there was not a pen pad available for signature.

## 2019-06-04 ENCOUNTER — Other Ambulatory Visit: Payer: Self-pay

## 2019-06-04 ENCOUNTER — Encounter (HOSPITAL_COMMUNITY): Payer: Self-pay | Admitting: Emergency Medicine

## 2019-06-04 ENCOUNTER — Emergency Department (HOSPITAL_COMMUNITY)
Admission: EM | Admit: 2019-06-04 | Discharge: 2019-06-04 | Disposition: A | Discharge status: Home / Self Care | Payer: Medicaid Other | Attending: Emergency Medicine | Admitting: Emergency Medicine

## 2019-06-04 DIAGNOSIS — Z91040 Latex allergy status: Secondary | ICD-10-CM | POA: Insufficient documentation

## 2019-06-04 DIAGNOSIS — Z79899 Other long term (current) drug therapy: Secondary | ICD-10-CM | POA: Insufficient documentation

## 2019-06-04 DIAGNOSIS — F17210 Nicotine dependence, cigarettes, uncomplicated: Secondary | ICD-10-CM | POA: Insufficient documentation

## 2019-06-04 DIAGNOSIS — M549 Dorsalgia, unspecified: Secondary | ICD-10-CM | POA: Insufficient documentation

## 2019-06-04 DIAGNOSIS — E119 Type 2 diabetes mellitus without complications: Secondary | ICD-10-CM | POA: Insufficient documentation

## 2019-06-04 HISTORY — DX: Radiculopathy, site unspecified: M54.10

## 2019-06-04 HISTORY — DX: Spinal stenosis, site unspecified: M48.00

## 2019-06-04 MED ORDER — KETOROLAC TROMETHAMINE 30 MG/ML IJ SOLN
30.0000 mg | Freq: Once | INTRAMUSCULAR | Status: AC
  Administered 2019-06-04: 06:00:00 30 mg via INTRAMUSCULAR
  Filled 2019-06-04: qty 1

## 2019-06-04 NOTE — ED Provider Notes (Signed)
[facility] EMERGENCY DEPARTMENT Provider Note   CSN: [id number] Arrival date & time: 06/04/19  0259     History Chief Complaint  Patient presents with  . Back Pain    Regina Howell is a 39 y.o. female.  Patient presents to the emergency department for evaluation of back pain.  Patient reports that she is experiencing severe and constant pain up and down her entire back.  She denies any recent fall.  No radiation to lower extremities.  No numbness, tingling, weakness of extremities, no change in bowel or bladder function.        Past Medical History:  Diagnosis Date  . Bowel obstruction (HCC)   . Diabetes mellitus    resolved after having daughter  . Mental health disorder   . Radiculopathy   . Spinal stenosis   . Ulcerative colitis (HCC)     There are no problems to display for this patient.   History reviewed. No pertinent surgical history.   OB History   No obstetric history on file.     Family History  Problem Relation Age of Onset  . Diabetes Mother   . Healthy Father     Social History   Tobacco Use  . Smoking status: Current Every Day Smoker    Packs/day: 0.33    Types: Cigarettes  . Smokeless tobacco: Never Used  Substance Use Topics  . Alcohol use: No  . Drug use: No    Home Medications Prior to Admission medications   Medication Sig Start Date End Date Taking? Authorizing Provider  cyclobenzaprine (FLEXERIL) 10 MG tablet Take 1 tablet (10 mg total) by mouth 2 (two) times daily as needed for muscle spasms. 05/19/19   Tegeler, Canary Brim, MD  lidocaine (LIDODERM) 5 % Place 1 patch onto the skin daily. Remove & Discard patch within 12 hours or as directed by MD 05/19/19   Tegeler, Canary Brim, MD  predniSONE (DELTASONE) 10 MG tablet Take 6 tabs by mouth daily  for 2 days, then 5 tabs for 2 days, then 4 tabs for 2 days, then 3 tabs for 2 days, 2 tabs for 2 days, then 1 tab by mouth daily for 2 days 05/12/19   Eustace Moore, MD  tiZANidine (ZANAFLEX) 4 MG tablet Take 1-2 tablets (4-8 mg total) by mouth every 6 (six) hours as needed for muscle spasms. 05/12/19   Eustace Moore, MD  mirtazapine (REMERON) 15 MG tablet Take 15 mg by mouth at bedtime. 04/02/19 05/12/19  [provider]  OLANZapine (ZYPREXA) 5 MG tablet Take 5 mg by mouth at bedtime.  05/12/19  [provider]  sertraline (ZOLOFT) 50 MG tablet Take 75 mg by mouth daily.  05/12/19  [provider]    Allergies    Vicodin [hydrocodone-acetaminophen] and Latex  Review of Systems   Review of Systems  Musculoskeletal: Positive for back pain.  All other systems reviewed and are negative.   Physical Exam Updated Vital Signs BP 99/68 (BP Location: Left Arm)   Pulse 77   Temp 98.2 F (36.8 C) (Oral)   Resp 18   Ht 5\' 5"  (1.651 m)   Wt 51 kg   LMP 05/06/2019   SpO2 99%   BMI 18.71 kg/m   Physical Exam Vitals and nursing note reviewed.  Constitutional:      General: She is not in acute distress.    Appearance: Normal appearance. She is well-developed.  HENT:  Head: Normocephalic and atraumatic.     Right Ear: Hearing normal.     Left Ear: Hearing normal.     Nose: Nose normal.  Eyes:     Conjunctiva/sclera: Conjunctivae normal.     Pupils: Pupils are equal, round, and reactive to light.  Cardiovascular:     Rate and Rhythm: Regular rhythm.     Heart sounds: S1 normal and S2 normal. No murmur. No friction rub. No gallop.   Pulmonary:     Effort: Pulmonary effort is normal. No respiratory distress.     Breath sounds: Normal breath sounds.  Chest:     Chest wall: No tenderness.  Abdominal:     General: Bowel sounds are normal.     Palpations: Abdomen is soft.     Tenderness: There is no abdominal tenderness. There is no guarding or rebound. Negative signs include Murphy's sign and McBurney's sign.     Hernia: No hernia is present.  Musculoskeletal:        General: Tenderness (Diffuse tenderness  everywhere in the back, paraspinal as well as midline) present. Normal range of motion.     Cervical back: Normal range of motion and neck supple.  Skin:    General: Skin is warm and dry.     Findings: No rash.  Neurological:     Mental Status: She is alert and oriented to person, place, and time.     GCS: GCS eye subscore is 4. GCS verbal subscore is 5. GCS motor subscore is 6.     Cranial Nerves: No cranial nerve deficit.     Sensory: No sensory deficit.     Coordination: Coordination normal.  Psychiatric:        Speech: Speech normal.        Behavior: Behavior normal.        Thought Content: Thought content normal.     ED Results / Procedures / Treatments   Labs (all labs ordered are listed, but only abnormal results are displayed) Labs Reviewed - No data to display  EKG None  Radiology No results found.  Procedures Procedures (including critical care time)  Medications Ordered in ED Medications - No data to display  ED Course  I have reviewed the triage vital signs and the nursing notes.  Pertinent labs & imaging results that were available during my care of the patient were reviewed by me and considered in my medical decision making (see chart for details).    MDM Rules/Calculators/A&P                      Patient presents to the emergency department for evaluation of diffuse back pain.  Patient was also recently seen in the ER for neck pain.  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  She was treated with steroids.  She reports that the neck still hurts but now her entire back is hurting even more.  She has follow-up scheduled with neurosurgery tomorrow.  She could not sleep tonight because of the pain.  No recent injury.  No fever.  No overlying rash.  Examination reveals diffuse soft tissue tenderness as well as midline tenderness (nothing focal) without any associated neurologic deficit.  Will provide analgesia.  Patient states that  she does not want narcotics. Encourage follow-up as is already scheduled. Final Clinical Impression(s) / ED Diagnoses Final diagnoses:  Acute bilateral back pain, unspecified back location    Rx / DC Orders ED  Discharge Orders    None       Gilda Crease, MD 06/04/19 [phone]

## 2019-06-04 NOTE — ED Triage Notes (Signed)
Patient reports persistent pain at mid/low back this week , denies injury or fall , patient stated history of spinal stenosis/radiculopathy. No urinary discomfort.

## 2019-06-05 ENCOUNTER — Other Ambulatory Visit: Payer: Self-pay | Admitting: Neurosurgery

## 2019-06-05 DIAGNOSIS — M5412 Radiculopathy, cervical region: Secondary | ICD-10-CM

## 2019-06-14 ENCOUNTER — Other Ambulatory Visit: Payer: Self-pay

## 2019-06-14 ENCOUNTER — Emergency Department (HOSPITAL_COMMUNITY)
Admission: EM | Admit: 2019-06-14 | Discharge: 2019-06-15 | Disposition: A | Discharge status: Home / Self Care | Payer: Medicaid Other | Attending: Emergency Medicine | Admitting: Emergency Medicine

## 2019-06-14 ENCOUNTER — Emergency Department (HOSPITAL_COMMUNITY): Payer: Medicaid Other

## 2019-06-14 ENCOUNTER — Encounter (HOSPITAL_COMMUNITY): Payer: Self-pay | Admitting: Emergency Medicine

## 2019-06-14 DIAGNOSIS — S39012A Strain of muscle, fascia and tendon of lower back, initial encounter: Secondary | ICD-10-CM | POA: Insufficient documentation

## 2019-06-14 DIAGNOSIS — E119 Type 2 diabetes mellitus without complications: Secondary | ICD-10-CM | POA: Insufficient documentation

## 2019-06-14 DIAGNOSIS — Y999 Unspecified external cause status: Secondary | ICD-10-CM | POA: Insufficient documentation

## 2019-06-14 DIAGNOSIS — Z91040 Latex allergy status: Secondary | ICD-10-CM | POA: Insufficient documentation

## 2019-06-14 DIAGNOSIS — W109XXA Fall (on) (from) unspecified stairs and steps, initial encounter: Secondary | ICD-10-CM | POA: Insufficient documentation

## 2019-06-14 DIAGNOSIS — Y929 Unspecified place or not applicable: Secondary | ICD-10-CM | POA: Insufficient documentation

## 2019-06-14 DIAGNOSIS — S8392XA Sprain of unspecified site of left knee, initial encounter: Secondary | ICD-10-CM

## 2019-06-14 DIAGNOSIS — Y9301 Activity, walking, marching and hiking: Secondary | ICD-10-CM | POA: Insufficient documentation

## 2019-06-14 DIAGNOSIS — F17210 Nicotine dependence, cigarettes, uncomplicated: Secondary | ICD-10-CM | POA: Insufficient documentation

## 2019-06-14 NOTE — ED Triage Notes (Signed)
Patient slipped and fell this evening , no LOC/ambulatory , reports pain at lower back and left posterior knee pain .

## 2019-06-15 LAB — URINALYSIS, ROUTINE W REFLEX MICROSCOPIC
Bilirubin Urine: NEGATIVE
Glucose, UA: NEGATIVE mg/dL
Hgb urine dipstick: NEGATIVE
Ketones, ur: NEGATIVE mg/dL
Nitrite: POSITIVE — AB
Protein, ur: NEGATIVE mg/dL
Specific Gravity, Urine: 1.012 (ref 1.005–1.030)
pH: 7 (ref 5.0–8.0)

## 2019-06-15 LAB — POC URINE PREG, ED: Preg Test, Ur: NEGATIVE

## 2019-06-15 MED ORDER — CEPHALEXIN 500 MG PO CAPS: 500.0000 mg | ORAL_CAPSULE | Freq: Two times a day (BID) | ORAL | 0 refills | Status: DC

## 2019-06-15 MED ORDER — IBUPROFEN 800 MG PO TABS
800.0000 mg | ORAL_TABLET | Freq: Once | ORAL | Status: DC
  Filled 2019-06-15: qty 1

## 2019-06-15 NOTE — ED Provider Notes (Signed)
[facility] EMERGENCY DEPARTMENT Provider Note   CSN: [id number] Arrival date & time: 06/14/19  2239     History Chief Complaint  Patient presents with  . Fall    Regina Howell is a 39 y.o. female.  Patient presents to the emergency department for evaluation after a fall.  She reports that she was walking down steps and her left knee gave out.  She fell backwards and injured her lower back and knee.  Patient reports that she has had chronic problems with the knee and it often gives out.  She did not hit her head or lose consciousness.  No neck or upper back pain.        Past Medical History:  Diagnosis Date  . Bowel obstruction ([place])   . Diabetes mellitus    resolved after having daughter  . Mental health disorder   . Radiculopathy   . Spinal stenosis   . Ulcerative colitis ([place])     There are no problems to display for this patient.   History reviewed. No pertinent surgical history.   OB History   No obstetric history on file.     Family History  Problem Relation Age of Onset  . Diabetes Mother   . Healthy Father     Social History   Tobacco Use  . Smoking status: Current Every Day Smoker    Packs/day: 0.33    Types: Cigarettes  . Smokeless tobacco: Never Used  Substance Use Topics  . Alcohol use: No  . Drug use: No    Home Medications Prior to Admission medications   Medication Sig Start Date End Date Taking? Authorizing Provider  cephALEXin (KEFLEX) 500 MG capsule Take 1 capsule (500 mg total) by mouth 2 (two) times daily. 06/15/19   Orpah Greek, MD  cyclobenzaprine (FLEXERIL) 10 MG tablet Take 1 tablet (10 mg total) by mouth 2 (two) times daily as needed for muscle spasms. 05/19/19   Tegeler, Gwenyth Allegra, MD  lamoTRIgine (LAMICTAL) 25 MG tablet Take 25 mg by mouth in the morning and at bedtime. 05/11/19   [provider]  lidocaine (LIDODERM) 5 % Place 1 patch onto the skin daily. Remove & Discard patch  within 12 hours or as directed by MD 05/19/19   Tegeler, Gwenyth Allegra, MD  naproxen sodium (ALEVE) 220 MG tablet Take 220 mg by mouth 2 (two) times daily as needed (headache).    [provider]  tiZANidine (ZANAFLEX) 4 MG tablet Take 1-2 tablets (4-8 mg total) by mouth every 6 (six) hours as needed for muscle spasms. 05/12/19   Raylene Everts, MD  mirtazapine (REMERON) 15 MG tablet Take 15 mg by mouth at bedtime. 04/02/19 05/12/19  [provider]  OLANZapine (ZYPREXA) 5 MG tablet Take 5 mg by mouth at bedtime.  05/12/19  [provider]  sertraline (ZOLOFT) 50 MG tablet Take 75 mg by mouth daily.  05/12/19  [provider]    Allergies    Vicodin [hydrocodone-acetaminophen] and Latex  Review of Systems   Review of Systems  Musculoskeletal: Positive for arthralgias and back pain.  All other systems reviewed and are negative.   Physical Exam Updated Vital Signs BP (!) 109/91 (BP Location: Right Arm)   Pulse 95   Temp 97.8 F (36.6 C) (Oral)   Resp 16   Ht 5\' 5"  (1.651 m)   Wt 46.3 kg   SpO2 99%   BMI 16.97 kg/m   Physical Exam Constitutional:  Comments: Patient sleeping upon my entering the room  HENT:     Head: Normocephalic and atraumatic.  Cardiovascular:     Rate and Rhythm: Normal rate and regular rhythm.  Pulmonary:     Effort: Pulmonary effort is normal.     Breath sounds: Normal breath sounds.  Abdominal:     Palpations: Abdomen is soft.  Musculoskeletal:        General: Tenderness present.     Cervical back: Normal range of motion and neck supple. No spinous process tenderness or muscular tenderness.     Lumbar back: Spasms and tenderness present. No bony tenderness.     Left knee: No swelling, deformity, effusion, erythema, ecchymosis or crepitus. Normal range of motion. Tenderness present. No LCL laxity, MCL laxity, ACL laxity or PCL laxity.Normal alignment and normal patellar mobility.  Skin:    General: Skin is warm  and dry.     Findings: No bruising.  Neurological:     General: No focal deficit present.     Mental Status: She is oriented to person, place, and time.  Psychiatric:        Behavior: Behavior normal.     ED Results / Procedures / Treatments   Labs (all labs ordered are listed, but only abnormal results are displayed) Labs Reviewed  URINALYSIS, ROUTINE W REFLEX MICROSCOPIC - Abnormal; Notable for the following components:      Result Value   APPearance HAZY (*)    Nitrite POSITIVE (*)    Leukocytes,Ua SMALL (*)    Bacteria, UA MANY (*)    All other components within normal limits  POC URINE PREG, ED    EKG None  Radiology DG Lumbar Spine Complete  Result Date: 06/14/2019 CLINICAL DATA:  Pain status post fall EXAM: LUMBAR SPINE - COMPLETE 4+ VIEW COMPARISON:  01/22/2012 FINDINGS: There is no evidence of lumbar spine fracture. Alignment is normal. Intervertebral disc spaces are maintained. There is a small calcified density projecting superior to the L4 transverse process on the right on the frontal view. This is of unknown clinical significance but could represent a right-sided nephrolith or ureteral stone in the appropriate clinical setting. IMPRESSION: 1. No evidence of lumbar spine fracture or subluxation. 2. Possible right-sided nephrolith or ureteral stone. Electronically Signed   By: Katherine Mantle M.D.   On: 06/14/2019 23:33   DG Knee Complete 4 Views Left  Result Date: 06/14/2019 CLINICAL DATA:  Pain status post fall EXAM: LEFT KNEE - COMPLETE 4+ VIEW COMPARISON:  None. FINDINGS: No evidence of fracture, dislocation, or joint effusion. No evidence of arthropathy or other focal bone abnormality. Soft tissues are unremarkable. IMPRESSION: Negative. Electronically Signed   By: Katherine Mantle M.D.   On: 06/14/2019 23:31    Procedures Procedures (including critical care time)  Medications Ordered in ED Medications - No data to display  ED Course  I have reviewed  the triage vital signs and the nursing notes.  Pertinent labs & imaging results that were available during my care of the patient were reviewed by me and considered in my medical decision making (see chart for details).    MDM Rules/Calculators/A&P                      Patient presents to the emergency department for evaluation of back pain and left knee pain after a fall.  Patient does have a history of chronic back pain.  Examination reveals pain out of proportion to the exam.  Patient was curled up in the bed sleeping when I entered the room but after awakening her she started to complain of significant pain and appeared to be in significant distress if I lightly touched her.  No neurologic deficits on exam.  She did not hit her head, no concern for intracranial or cervical injury.  Lumbar x-ray and knee x-ray are negative.  Patient does have findings on urinalysis that suggest infection.  She does report some urinary frequency and urgency.  We will treat empirically.  Final Clinical Impression(s) / ED Diagnoses Final diagnoses:  Lumbar strain, initial encounter  Sprain of left knee, unspecified ligament, initial encounter    Rx / DC Orders ED Discharge Orders         Ordered    cephALEXin (KEFLEX) 500 MG capsule  2 times daily     06/15/19 0258           Gilda Crease, MD 06/15/19 [phone]

## 2019-08-17 ENCOUNTER — Encounter (HOSPITAL_COMMUNITY): Payer: Self-pay

## 2019-08-17 ENCOUNTER — Emergency Department (HOSPITAL_COMMUNITY): Payer: Self-pay

## 2019-08-17 ENCOUNTER — Emergency Department (HOSPITAL_COMMUNITY)
Admission: EM | Admit: 2019-08-17 | Discharge: 2019-08-17 | Disposition: A | Discharge status: Home / Self Care | Payer: Self-pay | Attending: Emergency Medicine | Admitting: Emergency Medicine

## 2019-08-17 ENCOUNTER — Other Ambulatory Visit: Payer: Self-pay

## 2019-08-17 DIAGNOSIS — Y999 Unspecified external cause status: Secondary | ICD-10-CM | POA: Insufficient documentation

## 2019-08-17 DIAGNOSIS — S161XXA Strain of muscle, fascia and tendon at neck level, initial encounter: Secondary | ICD-10-CM | POA: Insufficient documentation

## 2019-08-17 DIAGNOSIS — S39012A Strain of muscle, fascia and tendon of lower back, initial encounter: Secondary | ICD-10-CM | POA: Insufficient documentation

## 2019-08-17 DIAGNOSIS — R55 Syncope and collapse: Secondary | ICD-10-CM | POA: Insufficient documentation

## 2019-08-17 DIAGNOSIS — F17210 Nicotine dependence, cigarettes, uncomplicated: Secondary | ICD-10-CM | POA: Insufficient documentation

## 2019-08-17 DIAGNOSIS — Y929 Unspecified place or not applicable: Secondary | ICD-10-CM | POA: Insufficient documentation

## 2019-08-17 DIAGNOSIS — Y939 Activity, unspecified: Secondary | ICD-10-CM | POA: Insufficient documentation

## 2019-08-17 MED ORDER — NAPROXEN SODIUM 220 MG PO TABS: 220.0000 mg | ORAL_TABLET | Freq: Two times a day (BID) | ORAL | 0 refills | Status: AC | PRN

## 2019-08-17 MED ORDER — METHOCARBAMOL 500 MG PO TABS: 500.0000 mg | ORAL_TABLET | Freq: Two times a day (BID) | ORAL | 0 refills | Status: AC

## 2019-08-17 MED ORDER — KETOROLAC TROMETHAMINE 60 MG/2ML IM SOLN
30.0000 mg | Freq: Once | INTRAMUSCULAR | Status: AC
  Administered 2019-08-17: 30 mg via INTRAMUSCULAR
  Filled 2019-08-17: qty 2

## 2019-08-17 MED ORDER — PREDNISONE 10 MG PO TABS: 40.0000 mg | ORAL_TABLET | Freq: Every day | ORAL | 0 refills | Status: AC

## 2019-08-17 MED ORDER — METHOCARBAMOL 500 MG PO TABS
1000.0000 mg | ORAL_TABLET | Freq: Once | ORAL | Status: AC
  Administered 2019-08-17: 1000 mg via ORAL
  Filled 2019-08-17: qty 2

## 2019-08-17 MED ORDER — PREDNISONE 20 MG PO TABS
60.0000 mg | ORAL_TABLET | Freq: Once | ORAL | Status: AC
  Administered 2019-08-17: 60 mg via ORAL
  Filled 2019-08-17: qty 3
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
## 2019-08-17 NOTE — ED Notes (Signed)
Pt transported to CT ?

## 2019-08-17 NOTE — Discharge Instructions (Addendum)
You were seen today for a neck and back injury. Your Ct and xrays were reassuring that there are no new findings. You do have arthritis that you should continue to see the spine doctor for. We will prescribe you muscle relaxants and anti inflammatories for your symptoms. Apply ice or heat to the areas of pain and perform gentle stretching

## 2019-09-30 ENCOUNTER — Encounter (HOSPITAL_COMMUNITY): Payer: Self-pay | Admitting: Emergency Medicine

## 2019-09-30 ENCOUNTER — Other Ambulatory Visit: Payer: Self-pay

## 2019-09-30 ENCOUNTER — Emergency Department (HOSPITAL_COMMUNITY)
Admission: EM | Admit: 2019-09-30 | Discharge: 2019-09-30 | Disposition: A | Discharge status: Home / Self Care | Payer: Medicaid Other | Attending: Emergency Medicine | Admitting: Emergency Medicine

## 2019-09-30 DIAGNOSIS — M5412 Radiculopathy, cervical region: Secondary | ICD-10-CM | POA: Insufficient documentation

## 2019-09-30 DIAGNOSIS — E119 Type 2 diabetes mellitus without complications: Secondary | ICD-10-CM | POA: Insufficient documentation

## 2019-09-30 DIAGNOSIS — F17210 Nicotine dependence, cigarettes, uncomplicated: Secondary | ICD-10-CM | POA: Insufficient documentation

## 2019-09-30 DIAGNOSIS — Z7984 Long term (current) use of oral hypoglycemic drugs: Secondary | ICD-10-CM | POA: Insufficient documentation

## 2019-09-30 DIAGNOSIS — Z91040 Latex allergy status: Secondary | ICD-10-CM | POA: Insufficient documentation

## 2019-09-30 MED ORDER — ACETAMINOPHEN 325 MG PO TABS
650.0000 mg | ORAL_TABLET | Freq: Once | ORAL | Status: AC
  Administered 2019-09-30: 650 mg via ORAL
  Filled 2019-09-30: qty 2

## 2019-09-30 MED ORDER — TIZANIDINE HCL 4 MG PO TABS: 4.0000 mg | ORAL_TABLET | Freq: Four times a day (QID) | ORAL | 0 refills | Status: DC | PRN

## 2019-09-30 MED ORDER — PREDNISONE 20 MG PO TABS
40.0000 mg | ORAL_TABLET | Freq: Once | ORAL | Status: AC
  Administered 2019-09-30: 40 mg via ORAL
  Filled 2019-09-30: qty 2

## 2019-09-30 MED ORDER — KETOROLAC TROMETHAMINE 30 MG/ML IJ SOLN
30.0000 mg | Freq: Once | INTRAMUSCULAR | Status: AC
  Administered 2019-09-30: 30 mg via INTRAMUSCULAR
  Filled 2019-09-30: qty 1

## 2019-09-30 MED ORDER — DICLOFENAC SODIUM 1 % EX GEL: 2.0000 g | Freq: Four times a day (QID) | CUTANEOUS | 0 refills | Status: AC

## 2019-09-30 MED ORDER — PREDNISONE 10 MG PO TABS: 30.0000 mg | ORAL_TABLET | Freq: Every day | ORAL | 0 refills | Status: AC

## 2019-09-30 NOTE — Discharge Instructions (Signed)
I have prescribed you a short course of prednisone, please use Tylenol and ibuprofen as discussed below.  Please use the muscle relaxer as prescribed to you as needed for additional pain control.  I have also prescribed you Voltaren gel which is a medication that you will placed on the skin of your neck and arm that hurts.  Please do some gentle stretching as well.

## 2019-09-30 NOTE — ED Triage Notes (Signed)
Pt adds that she has seen a neck specialist but since doesn't have insurance can't get the injections they are recommending.
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
## 2019-09-30 NOTE — ED Triage Notes (Signed)
Pt reports that dx with spinal stenosis and having pain radiating down left arm. Reports taking prescribed medications and not helping.

## 2021-04-07 ENCOUNTER — Ambulatory Visit (INDEPENDENT_AMBULATORY_CARE_PROVIDER_SITE_OTHER): Payer: Medicaid Other | Admitting: Obstetrics and Gynecology

## 2021-04-07 ENCOUNTER — Other Ambulatory Visit (HOSPITAL_COMMUNITY)
Admission: RE | Admit: 2021-04-07 | Discharge: 2021-04-07 | Disposition: A | Discharge status: Home / Self Care | Payer: Medicaid Other | Source: Ambulatory Visit | Attending: Obstetrics and Gynecology | Admitting: Obstetrics and Gynecology

## 2021-04-07 ENCOUNTER — Other Ambulatory Visit: Payer: Self-pay

## 2021-04-07 ENCOUNTER — Encounter: Payer: Self-pay | Admitting: Obstetrics and Gynecology

## 2021-04-07 DIAGNOSIS — Z202 Contact with and (suspected) exposure to infections with a predominantly sexual mode of transmission: Secondary | ICD-10-CM | POA: Insufficient documentation

## 2021-04-07 DIAGNOSIS — Z1231 Encounter for screening mammogram for malignant neoplasm of breast: Secondary | ICD-10-CM | POA: Insufficient documentation

## 2021-04-07 DIAGNOSIS — Z01419 Encounter for gynecological examination (general) (routine) without abnormal findings: Secondary | ICD-10-CM | POA: Insufficient documentation

## 2021-04-07 NOTE — Progress Notes (Signed)
Pt presents to office as new patient for yearly exam. LMP: 03/05/21. BCM: none Last pap: unknown. Pt has no additional complaints.

## 2021-04-07 NOTE — Progress Notes (Signed)
Regina Howell is a 41 y.o. G60P1011 female here for a routine annual gynecologic exam.  Current complaints: Desires STD testing.   Denies abnormal vaginal bleeding, discharge, pelvic pain, problems with intercourse or other gynecologic concerns.    Gynecologic History Patient's last menstrual period was 03/05/2021 (exact date). Contraception: condoms Last Pap: uncertain. Results were: normal Last mammogram: NA.   Obstetric History OB History  Gravida Para Term Preterm AB Living  2 1 1  0 1 1  SAB IAB Ectopic Multiple Live Births  0 1 0 0 0    # Outcome Date GA Lbr Len/2nd Weight Sex Delivery Anes PTL Lv  2 IAB           1 Term             Obstetric Comments  Vaginal delivery    Past Medical History:  Diagnosis Date   Bowel obstruction ([place])    Diabetes mellitus    resolved after having daughter   Mental health disorder    Radiculopathy    Spinal stenosis    Ulcerative colitis ([place])     History reviewed. No pertinent surgical history.  Current Outpatient Medications on File Prior to Visit  Medication Sig Dispense Refill   lamoTRIgine (LAMICTAL) 25 MG tablet Take 25 mg by mouth in the morning, at noon, and at bedtime.      methocarbamol (ROBAXIN) 500 MG tablet Take 1 tablet (500 mg total) by mouth 2 (two) times daily. 20 tablet 0   sertraline (ZOLOFT) 50 MG tablet Take 50 mg by mouth daily.     Vitamin D, Ergocalciferol, (DRISDOL) 1.25 MG (50000 UNIT) CAPS capsule Take 50,000 Units by mouth once a week.     [DISCONTINUED] mirtazapine (REMERON) 15 MG tablet Take 15 mg by mouth at bedtime.     [DISCONTINUED] OLANZapine (ZYPREXA) 5 MG tablet Take 5 mg by mouth at bedtime.     No current facility-administered medications on file prior to visit.    Allergies  Allergen Reactions   Vicodin [Hydrocodone-Acetaminophen] Itching and Swelling   Latex Rash    & irritation    Social History   Socioeconomic History   Marital status: Single    Spouse name: Not on file    Number of children: Not on file   Years of education: Not on file   Highest education level: Not on file  Occupational History   Not on file  Tobacco Use   Smoking status: Every Day    Packs/day: 0.33    Types: Cigarettes   Smokeless tobacco: Never  Vaping Use   Vaping Use: Never used  Substance and Sexual Activity   Alcohol use: No   Drug use: No   Sexual activity: Yes    Birth control/protection: None  Other Topics Concern   Not on file  Social History Narrative   Not on file   Social Determinants of Health   Financial Resource Strain: Not on file  Food Insecurity: Not on file  Transportation Needs: Not on file  Physical Activity: Not on file  Stress: Not on file  Social Connections: Not on file  Intimate Partner Violence: Not on file    Family History  Problem Relation Age of Onset   Diabetes Mother    Healthy Father     The following portions of the patient's history were reviewed and updated as appropriate: allergies, current medications, past family history, past medical history, past social history, past surgical history and problem list.  Review of Systems Pertinent items noted in HPI and remainder of comprehensive ROS otherwise negative.   Objective:  BP 95/63    Pulse 98    Ht 5\' 5"  (1.651 m)    Wt 95 lb 8 oz (43.3 kg)    LMP 03/05/2021 (Exact Date)    BMI 15.89 kg/m  CONSTITUTIONAL: Well-developed, well-nourished female in no acute distress.  HENT:  Normocephalic, atraumatic, External right and left ear normal. Oropharynx is clear and moist EYES: Conjunctivae and EOM are normal. Pupils are equal, round, and reactive to light. No scleral icterus.  NECK: Normal range of motion, supple, no masses.  Normal thyroid.  SKIN: Skin is warm and dry. No rash noted. Not diaphoretic. No erythema. No pallor. Sunset: Alert and oriented to person, place, and time. Normal reflexes, muscle tone coordination. No cranial nerve deficit noted. PSYCHIATRIC: Normal mood and  affect. Normal behavior. Normal judgment and thought content. CARDIOVASCULAR: Normal heart rate noted, regular rhythm RESPIRATORY: Clear to auscultation bilaterally. Effort and breath sounds normal, no problems with respiration noted. BREASTS: Symmetric in size. No masses, skin changes, nipple drainage, or lymphadenopathy. ABDOMEN: Soft, normal bowel sounds, no distention noted.  No tenderness, rebound or guarding.  PELVIC: Normal appearing external genitalia; normal appearing vaginal mucosa and cervix.  No abnormal discharge noted.  Pap smear obtained.  Normal uterine size, no other palpable masses, no uterine or adnexal tenderness. MUSCULOSKELETAL: Normal range of motion. No tenderness.  No cyanosis, clubbing, or edema.  2+ distal pulses.   Assessment:  Annual gynecologic examination with pap smear  STD exposure Plan:  Will follow up results of pap smear and manage accordingly. Mammogram scheduled. STD testing as per pt request Routine preventative health maintenance measures emphasized. Please refer to After Visit Summary for other counseling recommendations.    Chancy Milroy, MD, [place] Attending [place] for [facility], [place]

## 2021-04-07 NOTE — Patient Instructions (Signed)

## 2021-04-08 LAB — RPR+HBSAG+HCVAB+...
HIV Screen 4th Generation wRfx: NONREACTIVE
Hep C Virus Ab: <0.1 s/co ratio (ref 0.0–0.9)
Hepatitis B Surface Ag: NEGATIVE
RPR Ser Ql: NONREACTIVE

## 2021-04-10 LAB — CERVICOVAGINAL ANCILLARY ONLY
Chlamydia: NEGATIVE
Comment: NEGATIVE
Comment: NEGATIVE
Comment: NORMAL
Neisseria Gonorrhea: NEGATIVE
Trichomonas: NEGATIVE

## 2021-04-11 LAB — CYTOLOGY - PAP
Comment: NEGATIVE
Diagnosis: NEGATIVE
High risk HPV: NEGATIVE

## 2021-04-17 ENCOUNTER — Ambulatory Visit (HOSPITAL_BASED_OUTPATIENT_CLINIC_OR_DEPARTMENT_OTHER): Payer: Medicaid Other | Admitting: Radiology

## 2021-04-24 ENCOUNTER — Ambulatory Visit (HOSPITAL_BASED_OUTPATIENT_CLINIC_OR_DEPARTMENT_OTHER): Admission: RE | Admit: 2021-04-24 | Payer: Medicaid Other | Source: Ambulatory Visit | Admitting: Radiology

## 2021-09-18 ENCOUNTER — Other Ambulatory Visit: Payer: Self-pay | Admitting: Obstetrics and Gynecology

## 2021-09-18 DIAGNOSIS — Z202 Contact with and (suspected) exposure to infections with a predominantly sexual mode of transmission: Secondary | ICD-10-CM

## 2021-09-18 DIAGNOSIS — Z01419 Encounter for gynecological examination (general) (routine) without abnormal findings: Secondary | ICD-10-CM

## 2021-09-18 DIAGNOSIS — Z1231 Encounter for screening mammogram for malignant neoplasm of breast: Secondary | ICD-10-CM

## 2021-12-06 ENCOUNTER — Ambulatory Visit (HOSPITAL_COMMUNITY)
Admission: EM | Admit: 2021-12-06 | Discharge: 2021-12-06 | Disposition: A | Discharge status: Home / Self Care | Payer: Medicaid Other | Attending: Nurse Practitioner | Admitting: Nurse Practitioner

## 2021-12-06 ENCOUNTER — Encounter (HOSPITAL_COMMUNITY): Payer: Self-pay | Admitting: Emergency Medicine

## 2021-12-06 DIAGNOSIS — S161XXA Strain of muscle, fascia and tendon at neck level, initial encounter: Secondary | ICD-10-CM | POA: Diagnosis not present

## 2021-12-06 DIAGNOSIS — S39012A Strain of muscle, fascia and tendon of lower back, initial encounter: Secondary | ICD-10-CM

## 2021-12-06 MED ORDER — DEXAMETHASONE SODIUM PHOSPHATE 10 MG/ML IJ SOLN
10.0000 mg | Freq: Once | INTRAMUSCULAR | Status: AC
  Administered 2021-12-06: 10 mg via INTRAMUSCULAR

## 2021-12-06 MED ORDER — KETOROLAC TROMETHAMINE 60 MG/2ML IM SOLN
60.0000 mg | Freq: Once | INTRAMUSCULAR | Status: AC
  Administered 2021-12-06: 60 mg via INTRAMUSCULAR

## 2021-12-06 MED ORDER — DEXAMETHASONE SODIUM PHOSPHATE 10 MG/ML IJ SOLN
INTRAMUSCULAR | Status: AC
  Filled 2021-12-06: qty 1

## 2021-12-06 MED ORDER — KETOROLAC TROMETHAMINE 60 MG/2ML IM SOLN
INTRAMUSCULAR | Status: AC
  Filled 2021-12-06: qty 2

## 2021-12-06 NOTE — ED Triage Notes (Signed)
Pt reports neck and back spasms x 2 days. States she has spinal stenosis and wants to make sure everything is fine. States does not need x-rays, just requesting a pain shot. Does not like taking pills and muscle relaxers.

## 2021-12-06 NOTE — Discharge Instructions (Signed)
You may continue to take over-the-counter Aleve as needed for pain   Alternate between ice and heat to affected areas   Follow-up with ortho and/or spine specialist as needed
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
## 2022-04-02 ENCOUNTER — Other Ambulatory Visit: Payer: Self-pay | Admitting: Internal Medicine

## 2022-04-05 LAB — URINE CULTURE
MICRO NUMBER:: 14401904
SPECIMEN QUALITY:: ADEQUATE

## 2022-04-05 LAB — C. TRACHOMATIS/N. GONORRHOEAE RNA
C. trachomatis RNA, TMA: NOT DETECTED
N. gonorrhoeae RNA, TMA: NOT DETECTED

## 2022-08-16 ENCOUNTER — Emergency Department (HOSPITAL_COMMUNITY): Payer: Medicaid Other

## 2022-08-16 ENCOUNTER — Encounter (HOSPITAL_COMMUNITY): Payer: Self-pay | Admitting: Emergency Medicine

## 2022-08-16 ENCOUNTER — Other Ambulatory Visit: Payer: Self-pay

## 2022-08-16 ENCOUNTER — Emergency Department (HOSPITAL_COMMUNITY)
Admission: EM | Admit: 2022-08-16 | Discharge: 2022-08-16 | Disposition: A | Discharge status: Home / Self Care | Payer: Medicaid Other | Attending: Emergency Medicine | Admitting: Emergency Medicine

## 2022-08-16 DIAGNOSIS — R0989 Other specified symptoms and signs involving the circulatory and respiratory systems: Secondary | ICD-10-CM | POA: Diagnosis not present

## 2022-08-16 DIAGNOSIS — R070 Pain in throat: Secondary | ICD-10-CM | POA: Diagnosis not present

## 2022-08-16 DIAGNOSIS — R519 Headache, unspecified: Secondary | ICD-10-CM | POA: Insufficient documentation

## 2022-08-16 DIAGNOSIS — Z91040 Latex allergy status: Secondary | ICD-10-CM | POA: Diagnosis not present

## 2022-08-16 DIAGNOSIS — M542 Cervicalgia: Secondary | ICD-10-CM | POA: Insufficient documentation

## 2022-08-16 DIAGNOSIS — R079 Chest pain, unspecified: Secondary | ICD-10-CM | POA: Diagnosis not present

## 2022-08-16 DIAGNOSIS — R051 Acute cough: Secondary | ICD-10-CM | POA: Diagnosis not present

## 2022-08-16 DIAGNOSIS — R059 Cough, unspecified: Secondary | ICD-10-CM | POA: Diagnosis not present

## 2022-08-16 MED ORDER — ACETAMINOPHEN 325 MG PO TABS
650.0000 mg | ORAL_TABLET | Freq: Once | ORAL | Status: AC
  Administered 2022-08-16: 650 mg via ORAL
  Filled 2022-08-16: qty 2

## 2022-08-16 MED ORDER — PROMETHAZINE HCL 25 MG/ML IJ SOLN
25.0000 mg | Freq: Four times a day (QID) | INTRAMUSCULAR | Status: DC | PRN
  Administered 2022-08-16: 25 mg via INTRAMUSCULAR
  Filled 2022-08-16: qty 1

## 2022-08-16 MED ORDER — LIDOCAINE VISCOUS HCL 2 % MT SOLN
15.0000 mL | Freq: Once | OROMUCOSAL | Status: AC
  Administered 2022-08-16: 15 mL via OROMUCOSAL
  Filled 2022-08-16: qty 15

## 2022-08-16 MED ORDER — LORAZEPAM 0.5 MG PO TABS
0.5000 mg | ORAL_TABLET | Freq: Once | ORAL | Status: AC
  Administered 2022-08-16: 0.5 mg via ORAL
  Filled 2022-08-16: qty 1
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
## 2023-02-15 ENCOUNTER — Encounter (HOSPITAL_COMMUNITY): Payer: Self-pay

## 2023-02-15 ENCOUNTER — Ambulatory Visit (HOSPITAL_COMMUNITY)
Admission: EM | Admit: 2023-02-15 | Discharge: 2023-02-15 | Disposition: A | Discharge status: Home / Self Care | Payer: Medicaid Other | Attending: Emergency Medicine | Admitting: Emergency Medicine

## 2023-02-15 DIAGNOSIS — Z3202 Encounter for pregnancy test, result negative: Secondary | ICD-10-CM | POA: Insufficient documentation

## 2023-02-15 DIAGNOSIS — N760 Acute vaginitis: Secondary | ICD-10-CM | POA: Insufficient documentation

## 2023-02-15 DIAGNOSIS — N3000 Acute cystitis without hematuria: Secondary | ICD-10-CM | POA: Insufficient documentation

## 2023-02-15 LAB — POCT URINALYSIS DIP (MANUAL ENTRY)
Bilirubin, UA: NEGATIVE
Glucose, UA: NEGATIVE mg/dL
Ketones, POC UA: NEGATIVE mg/dL
Leukocytes, UA: NEGATIVE
Nitrite, UA: POSITIVE — AB
Protein Ur, POC: NEGATIVE mg/dL
Spec Grav, UA: 1.025 (ref 1.010–1.025)
Urobilinogen, UA: 0.2 U/dL
pH, UA: 7 (ref 5.0–8.0)

## 2023-02-15 LAB — POCT URINE PREGNANCY: Preg Test, Ur: NEGATIVE

## 2023-02-15 MED ORDER — NITROFURANTOIN MONOHYD MACRO 100 MG PO CAPS: 100.0000 mg | ORAL_CAPSULE | Freq: Two times a day (BID) | ORAL | 0 refills | Status: AC

## 2023-02-15 NOTE — ED Triage Notes (Signed)
Vaginal Irritation onset yesterday. Worse today. Has a slight odor when she pees.   Patient has history of ulcerative colitis. Patient is prone to BV and yeast infections.

## 2023-02-15 NOTE — ED Provider Notes (Signed)
[facility]    CSN: [id number] Arrival date & time: 02/15/23  1256      History   Chief Complaint Chief Complaint  Patient presents with   Vaginal Discharge   Dysuria    HPI Regina Howell is a 42 y.o. female.   Patient presents to clinic for complaints of a vaginal irritation that started over the past few days.  She does notice an odor when she urinates, is not sure if this is coming from her urine or any vaginal discharge.  Denies any vaginal itching.  No flank pain.  No nausea, vomiting or diarrhea.  She is sexually active with her boyfriend, last sexually active last week.  Reports she normally will take a bath after sexual activity and clean herself very thoroughly internally to remove her boyfriend's semen. Hx of trichomoniasis, but this feels different.   Denies any dysuria.  Reports she is prone to BV and yeast.  Her menstrual cycles are irregular but if they do not come at the beginning of the month they usually come at the end.  She has not yet had a menstrual cycle this month.  She has been fatigued the past few days and has been eating more.  The history is provided by the patient and medical records.  Vaginal Discharge Dysuria   Past Medical History:  Diagnosis Date   Bowel obstruction (HCC)    Diabetes mellitus    resolved after having daughter   Mental health disorder    Radiculopathy    Spinal stenosis    Ulcerative colitis [facility])     Patient Active Problem List   Diagnosis Date Noted   Visit for routine gyn exam 04/07/2021   Screening mammogram for breast cancer 04/07/2021   Exposure to STD 04/07/2021    History reviewed. No pertinent surgical history.  OB History     Gravida  2   Para  1   Term  1   Preterm  0   AB  1   Living  1      SAB  0   IAB  1   Ectopic  0   Multiple  0   Live Births  0        Obstetric Comments  Vaginal delivery          Home Medications    Prior to Admission medications    Medication Sig Start Date End Date Taking? Authorizing Provider  lamoTRIgine (LAMICTAL) 25 MG tablet Take 25 mg by mouth in the morning, at noon, and at bedtime.  05/11/19  Yes [provider]  nitrofurantoin, macrocrystal-monohydrate, (MACROBID) 100 MG capsule Take 1 capsule (100 mg total) by mouth 2 (two) times daily. 02/15/23  Yes Rinaldo Ratel, [place] N, FNP  Vitamin D, Ergocalciferol, (DRISDOL) 1.25 MG (50000 UNIT) CAPS capsule Take 50,000 Units by mouth once a week. 04/04/21  Yes [provider]  methocarbamol (ROBAXIN) 500 MG tablet Take 1 tablet (500 mg total) by mouth 2 (two) times daily. 08/17/19   Arlyn Dunning, PA-C  sertraline (ZOLOFT) 50 MG tablet Take 50 mg by mouth daily. 05/11/19   [provider]  mirtazapine (REMERON) 15 MG tablet Take 15 mg by mouth at bedtime. 04/02/19 05/12/19  [provider]  OLANZapine (ZYPREXA) 5 MG tablet Take 5 mg by mouth at bedtime.  05/12/19  [provider]    Family History Family History  Problem Relation Age of Onset   Diabetes Mother  Healthy Father     Social History Social History   Tobacco Use   Smoking status: Every Day    Current packs/day: 0.33    Types: Cigarettes   Smokeless tobacco: Never  Vaping Use   Vaping status: Never Used  Substance Use Topics   Alcohol use: No   Drug use: No     Allergies   Vicodin [hydrocodone-acetaminophen] and Latex   Review of Systems Review of Systems  Per HPI   Physical Exam Triage Vital Signs ED Triage Vitals  Encounter Vitals Group     BP 02/15/23 1350 118/73     Systolic BP Percentile --      Diastolic BP Percentile --      Pulse Rate 02/15/23 1350 72     Resp 02/15/23 1350 16     Temp 02/15/23 1350 100.3 F (37.9 C)     Temp Source 02/15/23 1350 Oral     SpO2 02/15/23 1350 99 %     Weight 02/15/23 1350 102 lb (46.3 kg)     Height 02/15/23 1350 5\' 5"  (1.651 m)     Head Circumference --      Peak Flow --      Pain Score  02/15/23 1348 0     Pain Loc --      Pain Education --      Exclude from Growth Chart --    No data found.  Updated Vital Signs BP 118/73 (BP Location: Right Arm)   Pulse 72   Temp 100.3 F (37.9 C) (Oral)   Resp 16   Ht 5\' 5"  (1.651 m)   Wt 102 lb (46.3 kg)   LMP  (LMP Unknown)   SpO2 99%   BMI 16.97 kg/m   Visual Acuity Right Eye Distance:   Left Eye Distance:   Bilateral Distance:    Right Eye Near:   Left Eye Near:    Bilateral Near:     Physical Exam Vitals and nursing note reviewed.  Constitutional:      Appearance: Normal appearance.  HENT:     Head: Normocephalic and atraumatic.     Right Ear: External ear normal.     Left Ear: External ear normal.     Nose: Nose normal.     Mouth/Throat:     Mouth: Mucous membranes are moist.  Cardiovascular:     Rate and Rhythm: Normal rate.  Pulmonary:     Effort: Pulmonary effort is normal. No respiratory distress.  Abdominal:     Tenderness: There is no right CVA tenderness or left CVA tenderness.  Musculoskeletal:        General: Normal range of motion.  Skin:    General: Skin is warm and dry.  Neurological:     General: No focal deficit present.     Mental Status: She is alert.  Psychiatric:        Mood and Affect: Mood normal.      UC Treatments / Results  Labs (all labs ordered are listed, but only abnormal results are displayed) Labs Reviewed  POCT URINALYSIS DIP (MANUAL ENTRY) - Abnormal; Notable for the following components:      Result Value   Color, UA light yellow (*)    Clarity, UA cloudy (*)    Blood, UA trace-intact (*)    Nitrite, UA Positive (*)    All other components within normal limits  URINE CULTURE  POCT URINE PREGNANCY  CERVICOVAGINAL ANCILLARY ONLY    EKG  Radiology No results found.  Procedures Procedures (including critical care time)  Medications Ordered in UC Medications - No data to display  Initial Impression / Assessment and Plan / UC Course  I have  reviewed the triage vital signs and the nursing notes.  Pertinent labs & imaging results that were available during my care of the patient were reviewed by me and considered in my medical decision making (see chart for details).  Vitals and triage reviewed, patient is hemodynamically stable.  Negative for CVA tenderness.  Temperature 100.3 in clinic, was outside smoking and a heavy jacket prior to arrival.  Denies dysuria, does have a weird vaginal sensation and urinary odor.  UA positive for nitrates.  Urine pregnancy negative, patient with regular menstrual cycles.  Cytology swab obtained due to vaginal irritation.  Sent in Macrobid and urine sent for culture.  Plan of care, follow-up care return precautions given, no questions at this time.     Final Clinical Impressions(s) / UC Diagnoses   Final diagnoses:  Acute cystitis without hematuria  Acute vaginitis  Urine pregnancy test negative     Discharge Instructions      Your urine showed that you have a urinary tract infection, this is most likely what has been causing your odor when you urinate.  Take all antibiotics as prescribed, you can take them twice daily with food to prevent gastrointestinal upset.  Ensure you are drinking at least 64 ounces of water daily.  We have tested you for vaginal infections today and we will contact you if anything results is positive.  Please abstain from intercourse until results have been received.  Return to clinic for any new emergent symptoms.     ED Prescriptions     Medication Sig Dispense Auth. Provider   nitrofurantoin, macrocrystal-monohydrate, (MACROBID) 100 MG capsule Take 1 capsule (100 mg total) by mouth 2 (two) times daily. 10 capsule Yadriel Kerrigan, [place] N, FNP      PDMP not reviewed this encounter.   Lyne Khurana, [place] N, [place] 02/15/23 [phone]

## 2023-02-15 NOTE — Discharge Instructions (Addendum)
Your urine showed that you have a urinary tract infection, this is most likely what has been causing your odor when you urinate.  Take all antibiotics as prescribed, you can take them twice daily with food to prevent gastrointestinal upset.  Ensure you are drinking at least 64 ounces of water daily.  We have tested you for vaginal infections today and we will contact you if anything results is positive.  Please abstain from intercourse until results have been received.  Return to clinic for any new emergent symptoms.

## 2023-02-17 LAB — URINE CULTURE: Culture: 70000 — AB

## 2023-02-19 ENCOUNTER — Telehealth (HOSPITAL_COMMUNITY): Payer: Self-pay | Admitting: Emergency Medicine

## 2023-02-19 LAB — CERVICOVAGINAL ANCILLARY ONLY
Bacterial Vaginitis (gardnerella): POSITIVE — AB
Candida Glabrata: NEGATIVE
Candida Vaginitis: POSITIVE — AB
Chlamydia: NEGATIVE
Comment: NEGATIVE
Comment: NEGATIVE
Comment: NEGATIVE
Comment: NEGATIVE
Comment: NEGATIVE
Comment: NORMAL
Neisseria Gonorrhea: NEGATIVE
Trichomonas: POSITIVE — AB

## 2023-02-19 MED ORDER — FLUCONAZOLE 150 MG PO TABS: 150.0000 mg | ORAL_TABLET | Freq: Once | ORAL | 0 refills | Status: AC

## 2023-02-19 MED ORDER — METRONIDAZOLE 500 MG PO TABS: 500.0000 mg | ORAL_TABLET | Freq: Two times a day (BID) | ORAL | 0 refills | Status: AC

## 2023-02-19 NOTE — Telephone Encounter (Signed)
Metronidazole for positive Trichomonas and BV, Diflucan for positive yeast, per protocol
